# Patient Record
Sex: Female | Born: 2000 | Race: Black or African American | Hispanic: No | Marital: Single | Smoking: Never smoker
Health system: Southern US, Community
[De-identification: ages and names within clinical notes are randomized; demographics above are authoritative.]

## PROBLEM LIST (undated history)

## (undated) DIAGNOSIS — N6452 Nipple discharge: Secondary | ICD-10-CM

## (undated) DIAGNOSIS — L709 Acne, unspecified: Secondary | ICD-10-CM

## (undated) DIAGNOSIS — N898 Other specified noninflammatory disorders of vagina: Secondary | ICD-10-CM

## (undated) DIAGNOSIS — R32 Unspecified urinary incontinence: Secondary | ICD-10-CM

## (undated) DIAGNOSIS — T7840XA Allergy, unspecified, initial encounter: Secondary | ICD-10-CM

## (undated) DIAGNOSIS — F419 Anxiety disorder, unspecified: Secondary | ICD-10-CM

## (undated) HISTORY — DX: Nipple discharge: N64.52

## (undated) HISTORY — DX: Unspecified urinary incontinence: R32

## (undated) HISTORY — DX: Allergy, unspecified, initial encounter: T78.40XA

## (undated) HISTORY — PX: NO PAST SURGERIES: SHX2092

## (undated) HISTORY — DX: Acne, unspecified: L70.9

## (undated) HISTORY — DX: Anxiety disorder, unspecified: F41.9

## (undated) HISTORY — DX: Other specified noninflammatory disorders of vagina: N89.8

---
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
## 2018-04-09 ENCOUNTER — Other Ambulatory Visit: Payer: Self-pay | Admitting: Pediatrics

## 2018-04-09 DIAGNOSIS — N946 Dysmenorrhea, unspecified: Secondary | ICD-10-CM

## 2018-04-10 ENCOUNTER — Other Ambulatory Visit: Payer: Self-pay | Admitting: Pediatrics

## 2018-06-20 DIAGNOSIS — H5213 Myopia, bilateral: Secondary | ICD-10-CM | POA: Diagnosis not present

## 2018-06-28 DIAGNOSIS — H5213 Myopia, bilateral: Secondary | ICD-10-CM | POA: Diagnosis not present

## 2018-08-19 DIAGNOSIS — H5213 Myopia, bilateral: Secondary | ICD-10-CM | POA: Diagnosis not present

## 2018-10-11 ENCOUNTER — Ambulatory Visit: Payer: No Typology Code available for payment source | Admitting: Internal Medicine

## 2019-07-05 DIAGNOSIS — H5213 Myopia, bilateral: Secondary | ICD-10-CM | POA: Diagnosis not present

## 2019-07-05 DIAGNOSIS — H52223 Regular astigmatism, bilateral: Secondary | ICD-10-CM | POA: Diagnosis not present

## 2019-07-10 DIAGNOSIS — H5213 Myopia, bilateral: Secondary | ICD-10-CM | POA: Diagnosis not present

## 2019-08-21 DIAGNOSIS — H5213 Myopia, bilateral: Secondary | ICD-10-CM | POA: Diagnosis not present

## 2019-12-11 ENCOUNTER — Encounter: Payer: Self-pay | Admitting: Pediatrics

## 2020-03-05 ENCOUNTER — Ambulatory Visit: Payer: Medicaid Other | Attending: Nurse Practitioner | Admitting: Nurse Practitioner

## 2020-03-05 ENCOUNTER — Encounter: Payer: Self-pay | Admitting: Nurse Practitioner

## 2020-03-05 ENCOUNTER — Other Ambulatory Visit: Payer: Self-pay

## 2020-03-05 VITALS — Ht 62.0 in | Wt 145.0 lb

## 2020-03-05 DIAGNOSIS — Z7689 Persons encountering health services in other specified circumstances: Secondary | ICD-10-CM

## 2020-03-05 DIAGNOSIS — K219 Gastro-esophageal reflux disease without esophagitis: Secondary | ICD-10-CM | POA: Diagnosis not present

## 2020-03-05 DIAGNOSIS — E663 Overweight: Secondary | ICD-10-CM | POA: Diagnosis not present

## 2020-03-05 DIAGNOSIS — E559 Vitamin D deficiency, unspecified: Secondary | ICD-10-CM

## 2020-03-05 DIAGNOSIS — D509 Iron deficiency anemia, unspecified: Secondary | ICD-10-CM

## 2020-03-05 MED ORDER — FAMOTIDINE 20 MG PO TABS: 20.0000 mg | ORAL_TABLET | Freq: Two times a day (BID) | ORAL | 1 refills | Status: DC

## 2020-03-05 NOTE — Progress Notes (Signed)
Virtual Visit via Telephone Note Due to national recommendations of social distancing due to COVID 19, telehealth visit is felt to be most appropriate for this patient at this time.  I discussed the limitations, risks, security and privacy concerns of performing an evaluation and management service by telephone and the availability of in person appointments. I also discussed with the patient that there may be a patient responsible charge related to this service. The patient expressed understanding and agreed to proceed.    I connected with Sharon Gonzalez on 03/05/20  at   8:50 AM EDT  EDT by telephone and verified that I am speaking with the correct person using two identifiers.   Consent I discussed the limitations, risks, security and privacy concerns of performing an evaluation and management service by telephone and the availability of in person appointments. I also discussed with the patient that there may be a patient responsible charge related to this service. The patient expressed understanding and agreed to proceed.   Location of Patient: Private  Residence   Location of Provider: [facility]    Persons participating in Telemedicine visit: Bertram Denver FNP-BC YY Martin CMA Sharon Gonzalez    History of Present Illness: Telemedicine visit for: Establish care  GERD Patient complains of symptoms of heartburn. This has been associated with upper abdominal discomfort, midespigastric pain, nausea and vomiting  She denies bilious reflux, choking on food and unexpected weight loss. Symptoms have been present for 2 months. She denies dysphagia.  She has not lost weight. She denies melena, hematochezia, hematemesis, and coffee ground emesis. Medical therapy in the past has included none.  States after she eats she feels nauseous.  Denies constipation and endorses daily bowel movements.  Diet does not consist of daily fruits and vegetables or fiber.  She works  second shift (2-11pm). Sometimes eats late at night. Only vomited once. Remains mostly nauseous.  Symptoms are unrelated to any specific foods or any specific time of day.  Relieving factor: eats something salty afterwards or drinking water.     Past Medical History:  Diagnosis Date  . Acne   . Allergy    seasonal allergies  . Anxiety   . Breast discharge 08/30/14  . Enuresis   . Vaginal discharge 08/30/14    Past Surgical History:  Procedure Laterality Date  . NO PAST SURGERIES      Family History  Problem Relation Age of Onset  . Asthma Mother   . Hyperlipidemia Mother   . Asthma Sister   . Diabetes Maternal Aunt        great aunt  . Stroke Maternal Aunt        great aunt    Social History   Socioeconomic History  . Marital status: Single    Spouse name: Not on file  . Number of children: Not on file  . Years of education: Not on file  . Highest education level: Not on file  Occupational History  . Not on file  Tobacco Use  . Smoking status: Never Smoker  . Smokeless tobacco: Never Used  Vaping Use  . Vaping Use: Never used  Substance and Sexual Activity  . Alcohol use: Not Currently  . Drug use: Not Currently  . Sexual activity: Not Currently  Other Topics Concern  . Not on file  Social History Narrative   Lives with mother, step-father, 2 sisters and 2 brothers   Social Determinants of Health   Financial Resource Strain:   .  Difficulty of Paying Living Expenses:   Food Insecurity:   . Worried About Programme researcher, broadcasting/film/video in the Last Year:   . Barista in the Last Year:   Transportation Needs:   . Freight forwarder (Medical):   Marland Kitchen Lack of Transportation (Non-Medical):   Physical Activity:   . Days of Exercise per Week:   . Minutes of Exercise per Session:   Stress:   . Feeling of Stress :   Social Connections:   . Frequency of Communication with Friends and Family:   . Frequency of Social Gatherings with Friends and Family:   . Attends  Religious Services:   . Active Member of Clubs or Organizations:   . Attends Banker Meetings:   Marland Kitchen Marital Status:      Observations/Objective: Awake, alert and oriented x 3   Review of Systems  Constitutional: Negative for fever, malaise/fatigue and weight loss.  HENT: Negative.  Negative for nosebleeds.   Eyes: Negative.  Negative for blurred vision, double vision and photophobia.  Respiratory: Negative.  Negative for cough and shortness of breath.   Cardiovascular: Negative.  Negative for chest pain, palpitations and leg swelling.  Gastrointestinal: Positive for abdominal pain, heartburn and nausea. Negative for blood in stool, constipation, diarrhea, melena and vomiting.  Musculoskeletal: Negative.  Negative for myalgias.  Neurological: Negative.  Negative for dizziness, focal weakness, seizures and headaches.  Psychiatric/Behavioral: Negative.  Negative for suicidal ideas.    Assessment and Plan: Sharon Gonzalez was seen today for new patient (initial visit).  Diagnoses and all orders for this visit:  Encounter to establish care  Iron deficiency anemia, unspecified iron deficiency anemia type -     CBC  Vitamin D deficiency -     VITAMIN D 25 Hydroxy (Vit-D Deficiency, Fractures)  Overweight (BMI 25.0-29.9) -     Lipid panel  Gastroesophageal reflux disease without esophagitis -     famotidine (PEPCID) 20 MG tablet; Take 1 tablet (20 mg total) by mouth 2 (two) times daily. INSTRUCTIONS: Avoid GERD Triggers: acidic, spicy or fried foods, caffeine, coffee, sodas,  alcohol and chocolate.    Follow Up Instructions Return in about 4 weeks (around 04/02/2020).     I discussed the assessment and treatment plan with the patient. The patient was provided an opportunity to ask questions and all were answered. The patient agreed with the plan and demonstrated an understanding of the instructions.   The patient was advised to call back or seek an in-person evaluation if the  symptoms worsen or if the condition fails to improve as anticipated.  I provided 16 minutes of non-face-to-face time during this encounter including median intraservice time, reviewing previous notes, labs, imaging, medications and explaining diagnosis and management.  Claiborne Rigg, FNP-BC

## 2020-03-06 ENCOUNTER — Other Ambulatory Visit: Payer: Self-pay | Admitting: Nurse Practitioner

## 2020-03-06 ENCOUNTER — Encounter: Payer: Self-pay | Admitting: Nurse Practitioner

## 2020-03-06 LAB — LIPID PANEL
Chol/HDL Ratio: 4.3 ratio (ref 0.0–4.4)
Cholesterol, Total: 153 mg/dL (ref 100–199)
HDL: 36 mg/dL — ABNORMAL LOW (ref >39)
LDL Chol Calc (NIH): 96 mg/dL (ref 0–99)
Triglycerides: 115 mg/dL (ref 0–149)
VLDL Cholesterol Cal: 21 mg/dL (ref 5–40)

## 2020-03-06 LAB — CBC
Hematocrit: 31.6 % — ABNORMAL LOW (ref 34.0–46.6)
Hemoglobin: 9.3 g/dL — ABNORMAL LOW (ref 11.1–15.9)
MCH: 20.8 pg — ABNORMAL LOW (ref 26.6–33.0)
MCHC: 29.4 g/dL — ABNORMAL LOW (ref 31.5–35.7)
MCV: 71 fL — ABNORMAL LOW (ref 79–97)
Platelets: 357 10*3/uL (ref 150–450)
RBC: 4.47 x10E6/uL (ref 3.77–5.28)
RDW: 18.3 % — ABNORMAL HIGH (ref 11.7–15.4)
WBC: 10.6 10*3/uL (ref 3.4–10.8)

## 2020-03-06 LAB — VITAMIN D 25 HYDROXY (VIT D DEFICIENCY, FRACTURES): Vit D, 25-Hydroxy: 14.5 ng/mL — ABNORMAL LOW (ref 30.0–100.0)

## 2020-03-06 MED ORDER — VITAMIN D (ERGOCALCIFEROL) 1.25 MG (50000 UNIT) PO CAPS: 50000.0000 [IU] | ORAL_CAPSULE | ORAL | 1 refills | Status: DC

## 2020-03-06 MED ORDER — FERROUS SULFATE 324 (65 FE) MG PO TBEC: 1.0000 | DELAYED_RELEASE_TABLET | Freq: Every day | ORAL | 0 refills | Status: DC

## 2020-04-15 ENCOUNTER — Encounter: Payer: No Typology Code available for payment source | Admitting: Nurse Practitioner

## 2020-06-21 DIAGNOSIS — Z20822 Contact with and (suspected) exposure to covid-19: Secondary | ICD-10-CM | POA: Diagnosis not present

## 2020-08-01 ENCOUNTER — Ambulatory Visit: Payer: Self-pay

## 2020-08-01 NOTE — Telephone Encounter (Signed)
Patient called and before the agent could transfer to NT, she hung up the phone. I called the patient back and she says she is back at work and unable to talk. She asked if I could call back at 1930. I advised to call back tomorrow before work, patient verbalized understanding.

## 2020-08-02 ENCOUNTER — Ambulatory Visit: Payer: Self-pay | Admitting: *Deleted

## 2020-08-02 NOTE — Telephone Encounter (Signed)
C/o feeling faint at work yesterday. Patient reports she was standing at beginning of her shift and  her stomach was hurting and she became hot, breaking out into a sweat and saw black "splotches" . Marland Kitchen When patient sat down symptoms went away. Heart was beating fast. Patient reports a similar episode happened 2 months ago. Vertigo reported after standing . Patient denies chest pain, difficulty breathing, headache, she is not pregnant and is not dehydrated. appt scheduled for 09/18/20. Care advise given. Patient verbalized understanding of care advise and to call back or go to [facility] or ED or call 911 if symptoms worsen.   Reason for Disposition . [1] Sudden standing caused simple fainting AND [2] now alert and feels fine  Answer Assessment - Initial Assessment Questions 1. ONSET: "How long were you unconscious?" (minutes) "When did it happen?"     Na was not unconscious  2. CONTENT: "What happened during period of unconsciousness?" (e.g., seizure activity)      na 3. MENTAL STATUS: "Alert and oriented now?" (oriented x 3 = name, month, location)      yes 4. TRIGGER: "What do you think caused the fainting?" "What were you doing just before you fainted?"  (e.g., exercise, sudden standing up, prolonged standing)     Stands for whole shift. But patient had just started work 5. RECURRENT SYMPTOM: "Have you ever passed out before?" If Yes, ask: "When was the last time?" and "What happened that time?"      No. Did have similar episode 2 months ago  6. INJURY: "Did you sustain any injury during the fall?"      Na did not fall 7. CARDIAC SYMPTOMS: "Have you had any of the following symptoms: chest pain, difficulty breathing, palpitations?"     Heart beating fast 8. NEUROLOGIC SYMPTOMS: "Have you had any of the following symptoms: headache, numbness, vertigo, weakness?"     Vertigo  After standing, weakness 9. GI SYMPTOMS: "Have you had any of the following symptoms: abdominal pain, vomiting, diarrhea, blood  in stools?"     Abdominal pain like she needs to "use the bathroom" 10. OTHER SYMPTOMS: "Do you have any other symptoms?"       no 11. PREGNANCY: "Is there any chance you are pregnant?" "When was your last menstrual period?"       LMP 07/12/20  Protocols used: [facility]

## 2020-08-06 ENCOUNTER — Other Ambulatory Visit: Payer: Self-pay

## 2020-08-06 ENCOUNTER — Encounter (HOSPITAL_COMMUNITY): Payer: Self-pay

## 2020-08-06 ENCOUNTER — Ambulatory Visit (HOSPITAL_COMMUNITY)
Admission: EM | Admit: 2020-08-06 | Discharge: 2020-08-06 | Disposition: A | Discharge status: Home / Self Care | Payer: Medicaid Other | Attending: Emergency Medicine | Admitting: Emergency Medicine

## 2020-08-06 DIAGNOSIS — J029 Acute pharyngitis, unspecified: Secondary | ICD-10-CM | POA: Diagnosis not present

## 2020-08-06 DIAGNOSIS — Z20822 Contact with and (suspected) exposure to covid-19: Secondary | ICD-10-CM | POA: Insufficient documentation

## 2020-08-06 LAB — POCT RAPID STREP A, ED / UC: Streptococcus, Group A Screen (Direct): NEGATIVE

## 2020-08-06 LAB — SARS CORONAVIRUS 2 (TAT 6-24 HRS): SARS Coronavirus 2: NEGATIVE

## 2020-08-06 NOTE — ED Triage Notes (Signed)
Patient complains of a sore throat since Thursday as well as sore(s) on her tongue beginning yesterday. Pt states these have interfered with her eating. Pt is oax4 and ambulatory and denies any other symptoms.

## 2020-08-06 NOTE — ED Provider Notes (Signed)
[facility]    CSN: [id number] Arrival date & time: 08/06/20  1039      History   Chief Complaint Chief Complaint  Patient presents with  . Sore Throat    Since thursday    HPI Sharon Gonzalez is a 20 y.o. female.   Patient presents with 5 to 6-day history of sore throat.  She states she had "sores" on her tongue yesterday but these have resolved.  She reports no difficulty swallowing and good oral intake.  She denies fever, chills, rash, cough, shortness of breath, abdominal pain, vomiting, diarrhea, or other symptoms.  Treatment attempted at home with OTC cold medication.  The history is provided by the patient and medical records.    Past Medical History:  Diagnosis Date  . Acne   . Allergy    seasonal allergies  . Anxiety   . Breast discharge 08/30/14  . Enuresis   . Vaginal discharge 08/30/14    Patient Active Problem List   Diagnosis Date Noted  . Elevated BP without diagnosis of hypertension 02/24/2017  . Seasonal allergic rhinitis 02/24/2017  . Eczema 02/24/2017  . Vitamin D deficiency 01/22/2016  . Peanut allergy 01/20/2016  . Iron deficiency anemia 01/20/2016  . 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 Acne 02/01/2013  . Dysmenorrhea 02/01/2013    Past Surgical History:  Procedure Laterality Date  . NO PAST SURGERIES      OB History   No obstetric history on file.      Home Medications    Prior to Admission medications   Medication Sig Start Date End Date Taking? Authorizing Provider  ibuprofen (ADVIL,MOTRIN) 800 MG tablet TAKE 1 TABLET BY MOUTH AT THE SIGN OF CRAMPS THEN 6 EVERY AS NEEDED FOR PAIN 04/10/18  Yes Gregor Hams, NP  triamcinolone cream (KENALOG) 0.1 % APPLY TO ECZEMA RASH 3 TIMES DAILY FOR FLARE UPS 11/10/17  Yes Theadore Nan, MD  Vitamin D, Ergocalciferol, (DRISDOL) 1.25 MG (50000 UNIT) CAPS capsule Take 1 capsule (50,000 Units total) by mouth every 7 (seven) days. 03/06/20  Yes Claiborne Rigg, NP  famotidine (PEPCID) 20 MG tablet Take 1 tablet (20 mg total) by mouth 2 (two) times daily. 03/05/20 04/04/20  Claiborne Rigg, NP  ferrous sulfate 324 (65 Fe) MG TBEC Take 1 tablet (325 mg total) by mouth daily. 03/06/20 06/04/20  Claiborne Rigg, NP    Family History Family History  Problem Relation Age of Onset  . Asthma Mother   . Hyperlipidemia Mother   . Asthma Sister   . Diabetes Maternal Aunt        great aunt  . Stroke Maternal Aunt        great aunt    Social History Social History   Tobacco Use  . Smoking status: Never Smoker  . Smokeless tobacco: Never Used  Vaping Use  . Vaping Use: Never used  Substance Use Topics  . Alcohol use: Not Currently  . Drug use: Not Currently     Allergies   Peanut-containing drug products   Review of Systems Review of Systems  Constitutional: Negative for chills and fever.  HENT: Positive for sore throat. Negative for ear pain.   Eyes: Negative for pain and visual disturbance.  Respiratory: Negative for cough and shortness of breath.   Cardiovascular: Negative for chest pain and palpitations.  Gastrointestinal: Negative for abdominal pain, diarrhea and vomiting.  Genitourinary: Negative for dysuria and hematuria.  Musculoskeletal: Negative for  arthralgias and back pain.  Skin: Negative for color change and rash.  Neurological: Negative for seizures and syncope.  All other systems reviewed and are negative.    Physical Exam Triage Vital Signs ED Triage Vitals  Enc Vitals Group     BP 08/06/20 1151 118/73     Pulse Rate 08/06/20 1151 66     Resp 08/06/20 1151 18     Temp 08/06/20 1151 98.3 F (36.8 C)     Temp Source 08/06/20 1151 Oral     SpO2 08/06/20 1151 100 %     Weight --      Height --      Head Circumference --      Peak Flow --      Pain Score 08/06/20 1153 3     Pain Loc --      Pain Edu? --      Excl. in GC? --    No data found.  Updated Vital Signs BP 118/73 (BP Location: Right Arm)    Pulse 66   Temp 98.3 F (36.8 C) (Oral)   Resp 18   LMP 07/12/2020 (Exact Date)   SpO2 100%   Visual Acuity Right Eye Distance:   Left Eye Distance:   Bilateral Distance:    Right Eye Near:   Left Eye Near:    Bilateral Near:     Physical Exam Vitals and nursing note reviewed.  Constitutional:      General: She is not in acute distress.    Appearance: She is well-developed and well-nourished. She is not ill-appearing.  HENT:     Head: Normocephalic and atraumatic.     Right Ear: Tympanic membrane normal.     Left Ear: Tympanic membrane normal.     Nose: Nose normal.     Mouth/Throat:     Mouth: Mucous membranes are moist.     Pharynx: Posterior oropharyngeal erythema present.  Eyes:     Conjunctiva/sclera: Conjunctivae normal.  Cardiovascular:     Rate and Rhythm: Normal rate and regular rhythm.     Heart sounds: Normal heart sounds.  Pulmonary:     Effort: Pulmonary effort is normal. No respiratory distress.     Breath sounds: Normal breath sounds.  Abdominal:     Palpations: Abdomen is soft.     Tenderness: There is no abdominal tenderness. There is no guarding or rebound.  Musculoskeletal:        General: No edema.     Cervical back: Neck supple.  Skin:    General: Skin is warm and dry.     Findings: No rash.  Neurological:     General: No focal deficit present.     Mental Status: She is alert and oriented to person, place, and time.     Gait: Gait normal.  Psychiatric:        Mood and Affect: Mood and affect and mood normal.        Behavior: Behavior normal.      UC Treatments / Results  Labs (all labs ordered are listed, but only abnormal results are displayed) Labs Reviewed  CULTURE, GROUP A STREP ([facility])  SARS CORONAVIRUS 2 (TAT 6-24 HRS)  POCT RAPID STREP A, ED / UC    EKG   Radiology No results found.  Procedures Procedures (including critical care time)  Medications Ordered in UC Medications - No data to display  Initial  Impression / Assessment and Plan / UC Course  I have reviewed the  triage vital signs and the nursing notes.  Pertinent labs & imaging results that were available during my care of the patient were reviewed by me and considered in my medical decision making (see chart for details).   Viral pharyngitis.  Rapid strep negative; culture pending.  COVID pending.  Instructed patient to self quarantine until the test results are back.  Discussed symptomatic treatment including Tylenol, rest, hydration.  Instructed patient to follow up with PCP if her symptoms are not improving  Patient agrees to plan of care.    Final Clinical Impressions(s) / UC Diagnoses   Final diagnoses:  Viral pharyngitis     Discharge Instructions     Your rapid strep test is negative.  A throat culture is pending; we will call you if it is positive requiring treatment.    Your COVID test is pending.  You should self quarantine until the test result is back.    Take Tylenol or ibuprofen as needed for fever or discomfort.  Rest and keep yourself hydrated.    Follow-up with your primary care provider if your symptoms are not improving.        ED Prescriptions    None     PDMP not reviewed this encounter.   Mickie Bail, NP 08/06/20 1250

## 2020-08-06 NOTE — Discharge Instructions (Addendum)
Your rapid strep test is negative.  A throat culture is pending; we will call you if it is positive requiring treatment.    Your COVID test is pending.  You should self quarantine until the test result is back.    Take Tylenol or ibuprofen as needed for fever or discomfort.  Rest and keep yourself hydrated.    Follow-up with your primary care provider if your symptoms are not improving.     

## 2020-08-09 LAB — CULTURE, GROUP A STREP (THRC)

## 2020-09-18 ENCOUNTER — Encounter: Payer: Self-pay | Admitting: Nurse Practitioner

## 2020-09-18 ENCOUNTER — Ambulatory Visit: Payer: Medicaid Other | Attending: Nurse Practitioner | Admitting: Nurse Practitioner

## 2020-09-18 ENCOUNTER — Other Ambulatory Visit: Payer: Self-pay

## 2020-09-18 VITALS — BP 112/79 | HR 89 | Temp 97.8°F | Ht 63.0 in | Wt 149.0 lb

## 2020-09-18 DIAGNOSIS — N946 Dysmenorrhea, unspecified: Secondary | ICD-10-CM

## 2020-09-18 DIAGNOSIS — K5909 Other constipation: Secondary | ICD-10-CM | POA: Diagnosis not present

## 2020-09-18 DIAGNOSIS — D75839 Thrombocytosis, unspecified: Secondary | ICD-10-CM

## 2020-09-18 DIAGNOSIS — Z0000 Encounter for general adult medical examination without abnormal findings: Secondary | ICD-10-CM | POA: Diagnosis not present

## 2020-09-18 DIAGNOSIS — D509 Iron deficiency anemia, unspecified: Secondary | ICD-10-CM

## 2020-09-18 DIAGNOSIS — L309 Dermatitis, unspecified: Secondary | ICD-10-CM | POA: Diagnosis not present

## 2020-09-18 MED ORDER — IBUPROFEN 800 MG PO TABS: ORAL_TABLET | ORAL | 1 refills | Status: DC

## 2020-09-18 MED ORDER — POLYETHYLENE GLYCOL 3350 17 GM/SCOOP PO POWD: ORAL | 1 refills | Status: DC

## 2020-09-18 MED ORDER — FERROUS SULFATE 324 (65 FE) MG PO TBEC: 1.0000 | DELAYED_RELEASE_TABLET | Freq: Every day | ORAL | 1 refills | Status: DC

## 2020-09-18 MED ORDER — TRIAMCINOLONE ACETONIDE 0.1 % EX CREA: TOPICAL_CREAM | CUTANEOUS | 3 refills | Status: DC

## 2020-09-18 NOTE — Progress Notes (Signed)
Assessment & Plan:  Sharon Gonzalez was seen today for annual exam.  Diagnoses and all orders for this visit:  Encounter for annual physical exam  Iron deficiency anemia, unspecified iron deficiency anemia type -     CBC -     Basic metabolic panel -     ferrous sulfate 324 (65 Fe) MG TBEC; Take 1 tablet (325 mg total) by mouth daily. May take every other day if causes constipation  Dysmenorrhea -     ibuprofen (ADVIL) 800 MG tablet; TAKE 1 TABLET BY MOUTH AT THE SIGN OF CRAMPS THEN 6 EVERY AS NEEDED FOR PAIN  Chronic constipation -     polyethylene glycol powder (GLYCOLAX/MIRALAX) 17 GM/SCOOP powder; Take one capful dissolved in 16 oz of water daily.  Eczema, unspecified type -     triamcinolone (KENALOG) 0.1 %; APPLY TO ECZEMA RASH 3 TIMES DAILY FOR FLARE UPS    Patient has been counseled on age-appropriate routine health concerns for screening and prevention. These are reviewed and up-to-date. Referrals have been placed accordingly. Immunizations are up-to-date or declined.    Subjective:   Chief Complaint  Patient presents with  . Annual Exam    Patient is here for a physical.    HPI Sharon Gonzalez 20 y.o. female presents to office today for a physical exam. She is sexually active however declines pap smear today.    Will refill triamcinolone today however she is not currently experiencing a flare up of her eczema.   Chronic Constipation Needs to increase intake or water, fruits and vegetables. Bowel movements only occurring a few times per week. Stools are hard.    Review of Systems  Constitutional: Negative for fever, malaise/fatigue and weight loss.  HENT: Negative.  Negative for nosebleeds.   Eyes: Negative.  Negative for blurred vision, double vision and photophobia.  Respiratory: Negative.  Negative for cough and shortness of breath.   Cardiovascular: Negative.  Negative for chest pain, palpitations and leg swelling.  Gastrointestinal: Negative.  Negative for  heartburn, nausea and vomiting.  Genitourinary: Negative.   Musculoskeletal: Negative.  Negative for myalgias.  Skin: Negative.   Neurological: Negative.  Negative for dizziness, focal weakness, seizures and headaches.  Endo/Heme/Allergies: Negative.   Psychiatric/Behavioral: Negative.  Negative for suicidal ideas.    Past Medical History:  Diagnosis Date  . Acne   . Allergy    seasonal allergies  . Anxiety   . Breast discharge 08/30/14  . Enuresis   . Vaginal discharge 08/30/14    Past Surgical History:  Procedure Laterality Date  . NO PAST SURGERIES      Family History  Problem Relation Age of Onset  . Asthma Mother   . Hyperlipidemia Mother   . Asthma Sister   . Diabetes Maternal Aunt        great aunt  . Stroke Maternal Aunt        great aunt    Social History Reviewed with no changes to be made today.   Outpatient Medications Prior to Visit  Medication Sig Dispense Refill  . Vitamin D, Ergocalciferol, (DRISDOL) 1.25 MG (50000 UNIT) CAPS capsule Take 1 capsule (50,000 Units total) by mouth every 7 (seven) days. 12 capsule 1  . ibuprofen (ADVIL,MOTRIN) 800 MG tablet TAKE 1 TABLET BY MOUTH AT THE SIGN OF CRAMPS THEN 6 EVERY AS NEEDED FOR PAIN 30 tablet 0  . triamcinolone cream (KENALOG) 0.1 % APPLY TO ECZEMA RASH 3 TIMES DAILY FOR FLARE UPS 454 g 0  .  famotidine (PEPCID) 20 MG tablet Take 1 tablet (20 mg total) by mouth 2 (two) times daily. 60 tablet 1  . ferrous sulfate 324 (65 Fe) MG TBEC Take 1 tablet (325 mg total) by mouth daily. 90 tablet 0   No facility-administered medications prior to visit.    Allergies  Allergen Reactions  . Peanut-Containing Drug Products Anaphylaxis       Objective:    BP 112/79 (BP Location: Left Arm, Patient Position: Sitting, Cuff Size: Normal)   Pulse 89   Temp 97.8 F (36.6 C) (Oral)   Ht 5\' 3"  (1.6 m)   Wt 149 lb (67.6 kg)   LMP 09/03/2020   SpO2 98%   BMI 26.39 kg/m  Wt Readings from Last 3 Encounters:  09/18/20  149 lb (67.6 kg)  03/05/20 145 lb (65.8 kg)  03/26/17 157 lb (71.2 kg) (89 %, Z= 1.22)*   * Growth percentiles are based on CDC (Girls, 2-20 Years) data.    Physical Exam Constitutional:      Appearance: She is well-developed and well-nourished.  HENT:     Head: Normocephalic and atraumatic.     Right Ear: External ear normal.     Left Ear: External ear normal.     Nose: Nose normal.     Mouth/Throat:     Lips: Pink.     Mouth: Oropharynx is clear and moist.     Dentition: No dental tenderness, gingival swelling, dental abscesses or gum lesions.     Pharynx: No oropharyngeal exudate.  Eyes:     General: No scleral icterus.       Right eye: No discharge.     Extraocular Movements: Extraocular movements intact and EOM normal.     Conjunctiva/sclera: Conjunctivae normal.     Pupils: Pupils are equal, round, and reactive to light.  Neck:     Thyroid: No thyromegaly.     Trachea: No tracheal deviation.  Cardiovascular:     Rate and Rhythm: Normal rate and regular rhythm.     Pulses: Intact distal pulses.     Heart sounds: Normal heart sounds. No murmur heard. No friction rub.  Pulmonary:     Effort: Pulmonary effort is normal. No accessory muscle usage or respiratory distress.     Breath sounds: Normal breath sounds. No decreased breath sounds, wheezing, rhonchi or rales.  Abdominal:     General: Bowel sounds are normal. There is no distension.     Palpations: Abdomen is soft. There is no mass.     Tenderness: There is no abdominal tenderness. There is no guarding or rebound.  Musculoskeletal:        General: No tenderness, deformity or edema. Normal range of motion.     Cervical back: Normal range of motion and neck supple.  Lymphadenopathy:     Cervical: No cervical adenopathy.  Skin:    General: Skin is warm and dry.     Findings: No erythema.  Neurological:     Mental Status: She is alert and oriented to person, place, and time.     Cranial Nerves: No cranial nerve  deficit.     Coordination: Coordination normal.     Deep Tendon Reflexes: Reflexes are normal and symmetric.     Reflex Scores:      Patellar reflexes are 2+ on the right side and 2+ on the left side. Psychiatric:        Mood and Affect: Mood and affect normal.  Speech: Speech normal.        Behavior: Behavior normal.        Thought Content: Thought content normal.        Judgment: Judgment normal.          Patient has been counseled extensively about nutrition and exercise as well as the importance of adherence with medications and regular follow-up. The patient was given clear instructions to go to ER or return to medical center if symptoms don't improve, worsen or new problems develop. The patient verbalized understanding.   Follow-up: Return if symptoms worsen or fail to improve.   Claiborne Rigg, FNP-BC [facility] [place], [place] [phone]   09/21/2020, 10:36 AM

## 2020-09-19 LAB — CBC
Hematocrit: 26.6 % — ABNORMAL LOW (ref 34.0–46.6)
Hemoglobin: 7.4 g/dL — ABNORMAL LOW (ref 11.1–15.9)
MCH: 18.8 pg — ABNORMAL LOW (ref 26.6–33.0)
MCHC: 27.8 g/dL — ABNORMAL LOW (ref 31.5–35.7)
MCV: 68 fL — ABNORMAL LOW (ref 79–97)
Platelets: 818 10*3/uL (ref 150–450)
RBC: 3.93 x10E6/uL (ref 3.77–5.28)
RDW: 22.8 % — ABNORMAL HIGH (ref 11.7–15.4)
WBC: 8.7 10*3/uL (ref 3.4–10.8)

## 2020-09-19 LAB — BASIC METABOLIC PANEL
BUN/Creatinine Ratio: 15 (ref 9–23)
BUN: 9 mg/dL (ref 6–20)
CO2: 20 mmol/L (ref 20–29)
Calcium: 9.2 mg/dL (ref 8.7–10.2)
Chloride: 101 mmol/L (ref 96–106)
Creatinine, Ser: 0.62 mg/dL (ref 0.57–1.00)
GFR calc Af Amer: 149 mL/min/{1.73_m2} (ref >59)
GFR calc non Af Amer: 129 mL/min/{1.73_m2} (ref >59)
Glucose: 75 mg/dL (ref 65–99)
Potassium: 4.6 mmol/L (ref 3.5–5.2)
Sodium: 138 mmol/L (ref 134–144)

## 2020-09-21 ENCOUNTER — Encounter: Payer: Self-pay | Admitting: Nurse Practitioner

## 2020-09-23 ENCOUNTER — Encounter: Payer: Self-pay | Admitting: Nurse Practitioner

## 2020-09-25 ENCOUNTER — Ambulatory Visit: Payer: No Typology Code available for payment source | Admitting: Family Medicine

## 2020-11-11 ENCOUNTER — Encounter: Payer: Self-pay | Admitting: Nurse Practitioner

## 2020-11-11 ENCOUNTER — Other Ambulatory Visit: Payer: Self-pay

## 2020-11-11 ENCOUNTER — Ambulatory Visit: Payer: Medicaid Other | Attending: Nurse Practitioner | Admitting: Nurse Practitioner

## 2020-11-11 DIAGNOSIS — Z30011 Encounter for initial prescription of contraceptive pills: Secondary | ICD-10-CM

## 2020-11-11 DIAGNOSIS — Z3009 Encounter for other general counseling and advice on contraception: Secondary | ICD-10-CM | POA: Diagnosis not present

## 2020-11-11 DIAGNOSIS — D509 Iron deficiency anemia, unspecified: Secondary | ICD-10-CM | POA: Diagnosis not present

## 2020-11-11 MED ORDER — FERROUS SULFATE 324 (65 FE) MG PO TBEC: 1.0000 | DELAYED_RELEASE_TABLET | Freq: Every day | ORAL | 1 refills | Status: DC

## 2020-11-11 MED ORDER — XULANE 150-35 MCG/24HR TD PTWK
1.0000 | MEDICATED_PATCH | TRANSDERMAL | 12 refills | Status: DC
Start: 2020-11-11 — End: 2021-01-22

## 2020-11-11 NOTE — Progress Notes (Signed)
Virtual Visit via Telephone Note Due to national recommendations of social distancing due to COVID 19, telehealth visit is felt to be most appropriate for this patient at this time.  I discussed the limitations, risks, security and privacy concerns of performing an evaluation and management service by telephone and the availability of in person appointments. I also discussed with the patient that there may be a patient responsible charge related to this service. The patient expressed understanding and agreed to proceed.    I connected with Sharon Gonzalez on 11/11/20  at   3:10 PM EDT  EDT by telephone and verified that I am speaking with the correct person using two identifiers.   Consent I discussed the limitations, risks, security and privacy concerns of performing an evaluation and management service by telephone and the availability of in person appointments. I also discussed with the patient that there may be a patient responsible charge related to this service. The patient expressed understanding and agreed to proceed.   Location of Patient: Private  Residence   Location of Provider: [facility]    Persons participating in Telemedicine visit: Sharon Gonzalez Sharon Gonzalez    History of Present Illness: Telemedicine visit for: Follow up   Contraception Counseling: Patient presents for contraception counseling. She is interested in starting the hormonal patch as a means to reduce her menorrhagia.  The patient has no complaints today. We discussed different forms of birth control and she would like to proceed with the patch. The patient is not sexually active. Pertinent past medical history: none. Menstrual cycles last 7 days. Heavy flow on days 1-3.      Past Medical History:  Diagnosis Date  . Acne   . Allergy    seasonal allergies  . Anxiety   . Breast discharge 08/30/14  . Enuresis   . Vaginal discharge 08/30/14    Past  Surgical History:  Procedure Laterality Date  . NO PAST SURGERIES      Family History  Problem Relation Age of Onset  . Asthma Mother   . Hyperlipidemia Mother   . Asthma Sister   . Diabetes Maternal Aunt        great aunt  . Stroke Maternal Aunt        great aunt    Social History   Socioeconomic History  . Marital status: Single    Spouse name: Not on file  . Number of children: Not on file  . Years of education: Not on file  . Highest education level: Not on file  Occupational History  . Not on file  Tobacco Use  . Smoking status: Never Smoker  . Smokeless tobacco: Never Used  Vaping Use  . Vaping Use: Never used  Substance and Sexual Activity  . Alcohol use: Not Currently  . Drug use: Not Currently  . Sexual activity: Not Currently    Birth control/protection: None  Other Topics Concern  . Not on file  Social History Narrative   Lives with mother, step-father, 2 sisters and 2 brothers   Social Determinants of Health   Financial Resource Strain: Not on file  Food Insecurity: Not on file  Transportation Needs: Not on file  Physical Activity: Not on file  Stress: Not on file  Social Connections: Not on file     Observations/Objective: Awake, alert and oriented x 3   Review of Systems  Constitutional: Negative for fever, malaise/fatigue and weight loss.  HENT: Negative.  Negative for nosebleeds.   Eyes: Negative.  Negative for blurred vision, double vision and photophobia.  Respiratory: Negative.  Negative for cough and shortness of breath.   Cardiovascular: Negative.  Negative for chest pain, palpitations and leg swelling.  Gastrointestinal: Negative.  Negative for heartburn, nausea and vomiting.  Genitourinary:       Menorrhagia   Musculoskeletal: Negative.  Negative for myalgias.  Neurological: Negative.  Negative for dizziness, focal weakness, seizures and headaches.  Psychiatric/Behavioral: Negative.  Negative for suicidal ideas.    Assessment  and Plan: Sharon Gonzalez was seen today for contraception.  Diagnoses and all orders for this visit:  Initiation of oral contraception -     POCT urine pregnancy; Future -     norelgestromin-ethinyl estradiol [employer]ico) 150-35 MCG/24HR transdermal patch; Place 1 patch onto the skin once a week.  Encounter for other general counseling and advice on contraception -     norelgestromin-ethinyl estradiol [employer]ico) 150-35 MCG/24HR transdermal patch; Place 1 patch onto the skin once a week.  Iron deficiency anemia, unspecified iron deficiency anemia type -     ferrous sulfate 324 (65 Fe) MG TBEC; Take 1 tablet (325 mg total) by mouth daily. May take every other day if causes constipation     Follow Up Instructions Return for CBC pending. .     I discussed the assessment and treatment plan with the patient. The patient was provided an opportunity to ask questions and all were answered. The patient agreed with the plan and demonstrated an understanding of the instructions.   The patient was advised to call back or seek an in-person evaluation if the symptoms worsen or if the condition fails to improve as anticipated.  I provided 15 minutes of non-face-to-face time during this encounter including median intraservice time, reviewing previous notes, labs, imaging, medications and explaining diagnosis and management.  Claiborne Rigg, FNP-BC

## 2020-11-14 ENCOUNTER — Other Ambulatory Visit: Payer: Self-pay

## 2020-11-14 DIAGNOSIS — D75839 Thrombocytosis, unspecified: Secondary | ICD-10-CM

## 2020-11-14 DIAGNOSIS — D509 Iron deficiency anemia, unspecified: Secondary | ICD-10-CM | POA: Diagnosis not present

## 2020-11-15 ENCOUNTER — Telehealth: Payer: Self-pay | Admitting: Internal Medicine

## 2020-11-15 LAB — CBC WITH DIFFERENTIAL/PLATELET
Basophils Absolute: 0.1 10*3/uL (ref 0.0–0.2)
Basos: 1 %
EOS (ABSOLUTE): 0.3 10*3/uL (ref 0.0–0.4)
Eos: 4 %
Hematocrit: 29.3 % — ABNORMAL LOW (ref 34.0–46.6)
Hemoglobin: 7.8 g/dL — ABNORMAL LOW (ref 11.1–15.9)
Immature Grans (Abs): 0 10*3/uL (ref 0.0–0.1)
Immature Granulocytes: 0 %
Lymphocytes Absolute: 2.2 10*3/uL (ref 0.7–3.1)
Lymphs: 32 %
MCH: 18.6 pg — ABNORMAL LOW (ref 26.6–33.0)
MCHC: 26.6 g/dL — ABNORMAL LOW (ref 31.5–35.7)
MCV: 70 fL — ABNORMAL LOW (ref 79–97)
Monocytes Absolute: 0.5 10*3/uL (ref 0.1–0.9)
Monocytes: 7 %
Neutrophils Absolute: 3.6 10*3/uL (ref 1.4–7.0)
Neutrophils: 56 %
Platelets: 1139 10*3/uL (ref 150–450)
RBC: 4.2 x10E6/uL (ref 3.77–5.28)
RDW: 26.8 % — ABNORMAL HIGH (ref 11.7–15.4)
WBC: 6.7 10*3/uL (ref 3.4–10.8)

## 2020-11-15 LAB — IRON,TIBC AND FERRITIN PANEL
Ferritin: 22 ng/mL (ref 15–150)
Iron Saturation: 3 % — CL (ref 15–55)
Iron: 12 ug/dL — ABNORMAL LOW (ref 27–159)
Total Iron Binding Capacity: 444 ug/dL (ref 250–450)
UIBC: 432 ug/dL — ABNORMAL HIGH (ref 131–425)

## 2020-11-15 NOTE — Telephone Encounter (Signed)
I received a call from the on-call nurse at the call center this a.m. at 6:47.  I was informed of critical platelet count of 1139 with previous count being 818.  On looking at patient's chart, I see that she has iron deficiency anemia. Call placed to patient this morning.  I asked about whether she has been having any headaches, blurred vision, bleeding gums or bruising.  Patient states that she has headaches about once a week.  She denies any blurred vision.  Occasional bleeding gums.  No bruising.  She admits that she has not been taking iron consistently.  Inform patient of her lab results showing that she is still anemic which has not improved much and increased platelet count.  Thrombocytosis can be seen in iron deficiency anemia OR this can be essential thrombocytosis.  I strongly suggest that she take the iron tablets consistently for at least a month and then her PCP can recheck CBC.  If platelet count still elevated at that time, she should be referred to a hematologist.  Other option is that her PCP may choose to send her to a hematologist at this time.  Message sent to her PCP.

## 2020-11-17 ENCOUNTER — Other Ambulatory Visit: Payer: Self-pay | Admitting: Nurse Practitioner

## 2020-11-17 ENCOUNTER — Encounter: Payer: Self-pay | Admitting: Nurse Practitioner

## 2020-11-17 DIAGNOSIS — D75839 Thrombocytosis, unspecified: Secondary | ICD-10-CM

## 2020-11-17 NOTE — Telephone Encounter (Signed)
NOTED

## 2020-11-18 ENCOUNTER — Telehealth: Payer: Self-pay | Admitting: Physician Assistant

## 2020-11-18 NOTE — Telephone Encounter (Signed)
Received a new hem referral from dr. Laural Benes for thrombocytosis. Sharon Gonzalez has been cld and scheduled to see [name] on 5/2 at 2pm. Pt aware to arrive 20 minutes early.

## 2020-11-25 ENCOUNTER — Inpatient Hospital Stay: Payer: Medicaid Other

## 2020-11-25 ENCOUNTER — Other Ambulatory Visit: Payer: Self-pay

## 2020-11-25 ENCOUNTER — Encounter: Payer: Self-pay | Admitting: Nurse Practitioner

## 2020-11-25 ENCOUNTER — Encounter: Payer: Self-pay | Admitting: Physician Assistant

## 2020-11-25 ENCOUNTER — Inpatient Hospital Stay: Payer: Medicaid Other | Attending: Physician Assistant | Admitting: Physician Assistant

## 2020-11-25 VITALS — BP 115/69 | HR 83 | Temp 97.9°F | Resp 17 | Ht 63.0 in | Wt 151.2 lb

## 2020-11-25 DIAGNOSIS — D500 Iron deficiency anemia secondary to blood loss (chronic): Secondary | ICD-10-CM | POA: Diagnosis not present

## 2020-11-25 DIAGNOSIS — D75839 Thrombocytosis, unspecified: Secondary | ICD-10-CM | POA: Diagnosis not present

## 2020-11-25 DIAGNOSIS — N920 Excessive and frequent menstruation with regular cycle: Secondary | ICD-10-CM

## 2020-11-25 LAB — CBC WITH DIFFERENTIAL (CANCER CENTER ONLY)
Abs Immature Granulocytes: 0.01 10*3/uL (ref 0.00–0.07)
Basophils Absolute: 0.1 10*3/uL (ref 0.0–0.1)
Basophils Relative: 1 %
Eosinophils Absolute: 0.4 10*3/uL (ref 0.0–0.5)
Eosinophils Relative: 6 %
HCT: 30.8 % — ABNORMAL LOW (ref 36.0–46.0)
Hemoglobin: 8.3 g/dL — ABNORMAL LOW (ref 12.0–15.0)
Immature Granulocytes: 0 %
Lymphocytes Relative: 43 %
Lymphs Abs: 3 10*3/uL (ref 0.7–4.0)
MCH: 18.3 pg — ABNORMAL LOW (ref 26.0–34.0)
MCHC: 26.9 g/dL — ABNORMAL LOW (ref 30.0–36.0)
MCV: 68 fL — ABNORMAL LOW (ref 80.0–100.0)
Monocytes Absolute: 0.7 10*3/uL (ref 0.1–1.0)
Monocytes Relative: 11 %
Neutro Abs: 2.7 10*3/uL (ref 1.7–7.7)
Neutrophils Relative %: 39 %
Platelet Count: 379 10*3/uL (ref 150–400)
RBC: 4.53 MIL/uL (ref 3.87–5.11)
RDW: 27.5 % — ABNORMAL HIGH (ref 11.5–15.5)
WBC Count: 6.9 10*3/uL (ref 4.0–10.5)
nRBC: 0 % (ref 0.0–0.2)

## 2020-11-25 LAB — RETIC PANEL
Immature Retic Fract: 38 % — ABNORMAL HIGH (ref 2.3–15.9)
RBC.: 4.52 MIL/uL (ref 3.87–5.11)
Retic Count, Absolute: 35.7 10*3/uL (ref 19.0–186.0)
Retic Ct Pct: 0.8 % (ref 0.4–3.1)
Reticulocyte Hemoglobin: 21.6 pg — ABNORMAL LOW (ref >27.9)

## 2020-11-25 LAB — SAVE SMEAR(SSMR), FOR PROVIDER SLIDE REVIEW

## 2020-11-25 LAB — C-REACTIVE PROTEIN: CRP: 0.8 mg/dL (ref <1.0)

## 2020-11-25 NOTE — Progress Notes (Signed)
[facility] [phone]   [phone]  INITIAL CONSULT NOTE  Patient Care Team: Claiborne Rigg, NP as PCP - General (Nurse Practitioner)  Hematological/Oncological History 1) Labs from prior: -09/18/2020: WBC 8.7, Hgb 7.4 (L), MCV 68 (L), Plt 818 (H) -11/14/2020: WBC 6.7, Hgb 7.8 (L), MCV 70 (L), Plt 1139 (H), TIBC 444, UIBC 432 (H), Iron 12 (L), Ferritin 22, Iron saturation 3% (L)  2) 11/25/2020: Establish care with Sharon Kaufmann PA-C  CHIEF COMPLAINTS/PURPOSE OF CONSULTATION:  "Thrombocytosis"  HISTORY OF PRESENTING ILLNESS:  Sharon Gonzalez 20 y.o. female with medical history significant for iron deficiency anemia presents to the clinic for thrombocytosis. Patient is accompanied by her mother for this visit.   On review of the previous records, Sharon Gonzalez was found to have worsening iron deficiency anemia on 11/14/2020 by PCP, Bertram Denver NP. It was felt to be secondary to menorrhagia and was started on daily ferrous sulfate daily and transdermal hormonal patch.   On exam today, Sharon Gonzalez reports chronic fatigue over the last two years. 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 She denies any changes to her appetite or dietary restrictions. Patient has occasional episodes of nausea, mainly when it is too hot at work. She denies any vomiting episodes or abdominal pain. Patient reports constipation after starting oral iron last week. She notes improvement of constipation after taking miralax. Patient denies easy bruising or signs of bleeding including hematochezia, melena, hematuria, hemoptysis, epistaxis or gingival bleeding. Patient has menstrual cycle every 28 days but cycle is heavy for 3 days. She requires changing a tampon and pad every hour. She recently started transdermal hormonal patch last week without any complications thus far. Patient has intermittent episodes of headaches, dizziness and presyncopal episodes. Patient  has mild shortness of breath with exertion but none at rest. Patient denies any fevers, chills, night sweats, chest pain, cough, edema or neuropathy. She has no other complaints. Rest of the 10 point ROS is below.   MEDICAL HISTORY:  Past Medical History:  Diagnosis Date  . Acne   . Allergy    seasonal allergies  . Anxiety   . Breast discharge 08/30/14  . Enuresis   . Vaginal discharge 08/30/14    SURGICAL HISTORY: Past Surgical History:  Procedure Laterality Date  . NO PAST SURGERIES      SOCIAL HISTORY: Social History   Socioeconomic History  . Marital status: Single    Spouse name: Not on file  . Number of children: Not on file  . Years of education: Not on file  . Highest education level: Not on file  Occupational History  . Not on file  Tobacco Use  . Smoking status: Never Smoker  . Smokeless tobacco: Never Used  Vaping Use  . Vaping Use: Never used  Substance and Sexual Activity  . Alcohol use: Not Currently  . Drug use: Not Currently  . Sexual activity: Not Currently    Birth control/protection: None  Other Topics Concern  . Not on file  Social History Narrative   Lives with mother, step-father, 2 sisters and 2 brothers   Social Determinants of Health   Financial Resource Strain: Not on file  Food Insecurity: Not on file  Transportation Needs: Not on file  Physical Activity: Not on file  Stress: Not on file  Social Connections: Not on file  Intimate Partner Violence: Not on file    FAMILY HISTORY: Family History  Problem  Relation Age of Onset  . Asthma Mother   . Hyperlipidemia Mother   . Iron deficiency Mother   . Asthma Sister   . Diabetes Maternal Aunt        great aunt  . Stroke Maternal Aunt        great aunt    ALLERGIES:  is allergic to peanut-containing drug products.  MEDICATIONS:  Current Outpatient Medications  Medication Sig Dispense Refill  . ferrous sulfate 324 (65 Fe) MG TBEC Take 1 tablet (325 mg total) by mouth daily. May  take every other day if causes constipation 90 tablet 1  . ibuprofen (ADVIL) 800 MG tablet TAKE 1 TABLET BY MOUTH AT THE SIGN OF CRAMPS THEN 6 EVERY AS NEEDED FOR PAIN 60 tablet 1  . norelgestromin-ethinyl estradiol Burr Medico) 150-35 MCG/24HR transdermal patch Place 1 patch onto the skin once a week. 3 patch 12  . polyethylene glycol powder (GLYCOLAX/MIRALAX) 17 GM/SCOOP powder Take one capful dissolved in 16 oz of water daily. 3350 g 1  . triamcinolone (KENALOG) 0.1 % APPLY TO ECZEMA RASH 3 TIMES DAILY FOR FLARE UPS 240 g 3  . Vitamin D, Ergocalciferol, (DRISDOL) 1.25 MG (50000 UNIT) CAPS capsule Take 1 capsule (50,000 Units total) by mouth every 7 (seven) days. 12 capsule 1   No current facility-administered medications for this visit.    REVIEW OF SYSTEMS:   Constitutional: ( - ) fevers, ( - )  chills , ( - ) night sweats Eyes: ( - ) blurriness of vision, ( - ) double vision, ( - ) watery eyes Ears, nose, mouth, throat, and face: ( - ) mucositis, ( - ) sore throat Respiratory: ( - ) cough, ( - ) dyspnea, ( - ) wheezes Cardiovascular: ( - ) palpitation, ( - ) chest discomfort, ( - ) lower extremity swelling Gastrointestinal:  ( - ) nausea, ( - ) heartburn, ( - ) change in bowel habits Skin: ( - ) abnormal skin rashes Lymphatics: ( - ) new lymphadenopathy, ( - ) easy bruising Neurological: ( - ) numbness, ( - ) tingling, ( - ) new weaknesses Behavioral/Psych: ( - ) mood change, ( - ) new changes  All other systems were reviewed with the patient and are negative.  PHYSICAL EXAMINATION: ECOG PERFORMANCE STATUS: 1 - Symptomatic but completely ambulatory  Vitals:   11/25/20 1417  BP: 115/69  Pulse: 83  Resp: 17  Temp: 97.9 F (36.6 C)  SpO2: 100%   Filed Weights   11/25/20 1417  Weight: 151 lb 3.2 oz (68.6 kg)    GENERAL: well appearing African American female in NAD  SKIN: skin color, texture, turgor are normal, no rashes or significant lesions EYES: conjunctiva are pink and  non-injected, sclera clear OROPHARYNX: no exudate, no erythema; lips, buccal mucosa, and tongue normal  NECK: supple, non-tender LYMPH:  no palpable lymphadenopathy in the cervical, axillary or supraclavicular lymph nodes.  LUNGS: clear to auscultation and percussion with normal breathing effort HEART: regular rate & rhythm and no murmurs and no lower extremity edema ABDOMEN: soft, non-tender, non-distended, normal bowel sounds Musculoskeletal: no cyanosis of digits and no clubbing  PSYCH: alert & oriented x 3, fluent speech NEURO: no focal motor/sensory deficits  LABORATORY DATA:  I have reviewed the data as listed CBC Latest Ref Rng & Units 11/25/2020 11/14/2020 09/18/2020  WBC 4.0 - 10.5 K/uL 6.9 6.7 8.7  Hemoglobin 12.0 - 15.0 g/dL 8.3(L) 7.8(L) 7.4(L)  Hematocrit 36.0 - 46.0 % 30.8(L) 29.3(L) 26.6(L)  Platelets 150 - 400 K/uL 379 1,139(HH) 818(HH)    CMP Latest Ref Rng & Units 09/18/2020  Glucose 65 - 99 mg/dL 75  BUN 6 - 20 mg/dL 9  Creatinine 4.69 - 6.29 mg/dL 5.28  Sodium 413 - 244 mmol/L 138  Potassium 3.5 - 5.2 mmol/L 4.6  Chloride 96 - 106 mmol/L 101  CO2 20 - 29 mmol/L 20  Calcium 8.7 - 10.2 mg/dL 9.2   ASSESSMENT & PLAN Deniya Craigo is a 20 y.o. female presenting to the clinic for evaluation for thrombocytosis. The likely cause of thrombocytosis is reactive secondary to iron deficiency anemia. Patient was recently started on ferrous sulfate 325 mg daily and transdermal hormonal patch last week. Based on recent iron panel on 11/14/2020, I recommend weekly IV feraheme x 2 doses. To be thorough, I will proceed with workup to rule out other causes of thrombocytosis including myeloproliferative disorders.   # Thrombocytosis: --Likely reactive secondary to severe iron deficiency anemia --Labs today to rule out other causes. This includes CBC, CRP, Retic Panel, MPN panel, BCR-ABL, Save Smear  # Iron deficiency anemia 2/2 menorrhagia: --Currently on ferrous sulfate 325 mg daily  and transdermal hormonal patch to reduce her menorrhagia. --Recommend IV feraheme x 2 doses.  --Provided list of iron rich foods to incorporate in diet --RTC 6 weeks after completing IV iron treatments.   Orders Placed This Encounter  Procedures  . CBC with Differential (Cancer Center Only)    Standing Status:   Future    Number of Occurrences:   1    Standing Expiration Date:   11/24/2021  . JAK2 (including V617F and Exon 12), MPL, and CALR-Next Generation Sequencing    Standing Status:   Future    Number of Occurrences:   1    Standing Expiration Date:   11/24/2021  . BCR ABL1 FISH ([employer]Path)    Standing Status:   Future    Number of Occurrences:   1    Standing Expiration Date:   11/24/2021  . Retic Panel    Standing Status:   Future    Number of Occurrences:   1    Standing Expiration Date:   11/24/2021  . C-reactive protein    Standing Status:   Future    Number of Occurrences:   1    Standing Expiration Date:   11/25/2021  . Save Smear (SSMR)    Standing Status:   Future    Number of Occurrences:   1    Standing Expiration Date:   11/25/2021    All questions were answered. The patient knows to call the clinic with any problems, questions or concerns.  A total of more than 60 minutes were spent on this encounter and over half of that time was spent on counseling and coordination of care as outlined above.    Sharon Kaufmann, PA-C Department of Hematology/Oncology [facility] Phone: [phone]

## 2020-11-26 ENCOUNTER — Telehealth: Payer: Self-pay | Admitting: Physician Assistant

## 2020-11-26 NOTE — Telephone Encounter (Signed)
Attempted to leave a message with follow-up appointments per 5/2 los. Voicemail not set up. Mailed calendar.

## 2020-12-03 LAB — BCR ABL1 FISH (GENPATH)

## 2020-12-03 LAB — JAK2 (INCLUDING V617F AND EXON 12), MPL,& CALR-NEXT GEN SEQ

## 2020-12-04 ENCOUNTER — Telehealth: Payer: Self-pay | Admitting: Physician Assistant

## 2020-12-04 NOTE — Telephone Encounter (Signed)
I called patient's mother, Sharon Gonzalez, as I was unable to reach Ms. Sharon Gonzalez and leave a voicemail. I reviewed the lab results after our consultation. Results indicate iron deficiency anemia. Platelet counts are back to normal. MPN panel and BCR-ABL were negative. Recommend to proceed with IV feraheme infusions that are scheduled for 12/06/2020 and 12/13/2020. I will plan to see the patient back in June with repeat labs. Ms. Sharon Gonzalez expressed understanding and satisfaction with the plan provided.

## 2020-12-06 ENCOUNTER — Inpatient Hospital Stay: Payer: Medicaid Other

## 2020-12-06 ENCOUNTER — Other Ambulatory Visit: Payer: Self-pay

## 2020-12-06 ENCOUNTER — Ambulatory Visit (HOSPITAL_BASED_OUTPATIENT_CLINIC_OR_DEPARTMENT_OTHER): Payer: Medicaid Other | Admitting: Medical

## 2020-12-06 VITALS — BP 117/65 | HR 78 | Temp 98.3°F | Resp 16

## 2020-12-06 DIAGNOSIS — D500 Iron deficiency anemia secondary to blood loss (chronic): Secondary | ICD-10-CM

## 2020-12-06 DIAGNOSIS — T8090XA Unspecified complication following infusion and therapeutic injection, initial encounter: Secondary | ICD-10-CM | POA: Diagnosis not present

## 2020-12-06 MED ORDER — FERUMOXYTOL INJECTION 510 MG/17 ML
510.0000 mg | Freq: Once | INTRAVENOUS | Status: AC
  Administered 2020-12-06: 510 mg via INTRAVENOUS
  Filled 2020-12-06: qty 510

## 2020-12-06 MED ORDER — FAMOTIDINE 20 MG IN NS 100 ML IVPB
20.0000 mg | Freq: Once | INTRAVENOUS | Status: AC | PRN
  Administered 2020-12-06: 20 mg via INTRAVENOUS

## 2020-12-06 MED ORDER — DIPHENHYDRAMINE HCL 50 MG/ML IJ SOLN
50.0000 mg | Freq: Once | INTRAMUSCULAR | Status: AC | PRN
  Administered 2020-12-06: 25 mg via INTRAVENOUS

## 2020-12-06 MED ORDER — SODIUM CHLORIDE 0.9 % IV SOLN
Freq: Once | INTRAVENOUS | Status: AC
Start: 2020-12-06 — End: 2020-12-06
  Filled 2020-12-06: qty 250

## 2020-12-06 NOTE — Patient Instructions (Signed)
Ferumoxytol injection What is this medicine? FERUMOXYTOL is an iron complex. Iron is used to make healthy red blood cells, which carry oxygen and nutrients throughout the body. This medicine is used to treat iron deficiency anemia. This medicine may be used for other purposes; ask your health care provider or pharmacist if you have questions. COMMON BRAND NAME(S): Feraheme What should I tell my health care provider before I take this medicine? They need to know if you have any of these conditions:  anemia not caused by low iron levels  high levels of iron in the blood  magnetic resonance imaging (MRI) test scheduled  an unusual or allergic reaction to iron, other medicines, foods, dyes, or preservatives  pregnant or trying to get pregnant  breast-feeding How should I use this medicine? This medicine is for injection into a vein. It is given by a health care professional in a hospital or clinic setting. Talk to your pediatrician regarding the use of this medicine in children. Special care may be needed. Overdosage: If you think you have taken too much of this medicine contact a poison control center or emergency room at once. NOTE: This medicine is only for you. Do not share this medicine with others. What if I miss a dose? It is important not to miss your dose. Call your doctor or health care professional if you are unable to keep an appointment. What may interact with this medicine? This medicine may interact with the following medications:  other iron products This list may not describe all possible interactions. Give your health care provider a list of all the medicines, herbs, non-prescription drugs, or dietary supplements you use. Also tell them if you smoke, drink alcohol, or use illegal drugs. Some items may interact with your medicine. What should I watch for while using this medicine? Visit your doctor or healthcare professional regularly. Tell your doctor or healthcare  professional if your symptoms do not start to get better or if they get worse. You may need blood work done while you are taking this medicine. You may need to follow a special diet. Talk to your doctor. Foods that contain iron include: whole grains/cereals, dried fruits, beans, or peas, leafy green vegetables, and organ meats (liver, kidney). What side effects may I notice from receiving this medicine? Side effects that you should report to your doctor or health care professional as soon as possible:  allergic reactions like skin rash, itching or hives, swelling of the face, lips, or tongue  breathing problems  changes in blood pressure  feeling faint or lightheaded, falls  fever or chills  flushing, sweating, or hot feelings  swelling of the ankles or feet Side effects that usually do not require medical attention (report to your doctor or health care professional if they continue or are bothersome):  diarrhea  headache  nausea, vomiting  stomach pain This list may not describe all possible side effects. Call your doctor for medical advice about side effects. You may report side effects to FDA at 1-800-FDA-1088. Where should I keep my medicine? This drug is given in a hospital or clinic and will not be stored at home. NOTE: This sheet is a summary. It may not cover all possible information. If you have questions about this medicine, talk to your doctor, pharmacist, or health care provider.  2021 Elsevier/Gold Standard (2016-08-31 20:21:10)  

## 2020-12-06 NOTE — Progress Notes (Signed)
Approximately 5 minutes into feraheme infusion, pt c/o shortness of breath, cough, and dizziness.  Infusion paused.  NS ran wide open.  VSS.  Pepcid given at 0900 and Benadryl 25mg  given 0912.  Pt started feeling better almost immediately after infusion paused.  , PA to bedside.  Instructed to restart infusion at approximately 0925.  Pt tolerated remainder of infusion well.  Observed 30 minutes post infusion.  VSS.  No complaints.

## 2020-12-12 NOTE — Progress Notes (Signed)
     DATE:  12/06/2020                                         X  INFUSION REACTION     PROVIDER: Elita Quick, PA-C   AGENT/BLOOD PRODUCT RECEIVING TODAY:              Feraheme   AGENT/BLOOD PRODUCT RECEIVING IMMEDIATELY PRIOR TO REACTION:          Feraheme   VS: BP:     129/79   P:       70       SPO2:       100 % on room air                  REACTION(S):      SOB, cough and dizziness       PREMEDS:     none   INTERVENTION: Infusion paused and patient given Pepcid 20 mg IV and Benadryl 25 mg IN   Review of Systems  Review of Systems  Constitutional: Negative for chills, diaphoresis and fever.  HENT: Negative for trouble swallowing and voice change.   Respiratory: Positive for cough and shortness of breath. Negative for chest tightness and wheezing.   Cardiovascular: Negative for chest pain and palpitations.  Gastrointestinal: Negative for abdominal pain, constipation, diarrhea, nausea and vomiting.  Musculoskeletal: Negative for back pain and myalgias.  Neurological: Positive for dizziness. Negative for light-headedness and headaches.     Physical Exam  Physical Exam Constitutional:      General: She is not in acute distress.    Appearance: She is not diaphoretic.  HENT:     Head: Normocephalic and atraumatic.  Cardiovascular:     Rate and Rhythm: Normal rate and regular rhythm.     Heart sounds: Normal heart sounds. No murmur heard. No friction rub. No gallop.   Pulmonary:     Effort: Pulmonary effort is normal. No respiratory distress.     Breath sounds: Normal breath sounds. No wheezing or rales.  Skin:    General: Skin is warm and dry.     Findings: No erythema or rash.  Neurological:     Mental Status: She is alert.     OUTCOME:                Feraheme was restarted and completed without additional issues of concern.   Marga Hoots, MHS, PA-C

## 2020-12-13 ENCOUNTER — Inpatient Hospital Stay: Payer: Medicaid Other

## 2020-12-13 ENCOUNTER — Other Ambulatory Visit: Payer: Self-pay

## 2020-12-13 ENCOUNTER — Encounter: Payer: Self-pay | Admitting: Pharmacist

## 2020-12-13 VITALS — BP 122/65 | HR 84 | Temp 98.7°F | Resp 16

## 2020-12-13 DIAGNOSIS — D500 Iron deficiency anemia secondary to blood loss (chronic): Secondary | ICD-10-CM | POA: Diagnosis not present

## 2020-12-13 MED ORDER — SODIUM CHLORIDE 0.9 % IV SOLN
510.0000 mg | Freq: Once | INTRAVENOUS | Status: AC
  Administered 2020-12-13: 510 mg via INTRAVENOUS
  Filled 2020-12-13: qty 510

## 2020-12-13 MED ORDER — FAMOTIDINE 20 MG IN NS 100 ML IVPB
INTRAVENOUS | Status: AC
  Filled 2020-12-13: qty 100

## 2020-12-13 MED ORDER — DIPHENHYDRAMINE HCL 50 MG/ML IJ SOLN
INTRAMUSCULAR | Status: AC
  Filled 2020-12-13: qty 1

## 2020-12-13 MED ORDER — FAMOTIDINE 20 MG IN NS 100 ML IVPB
20.0000 mg | Freq: Once | INTRAVENOUS | Status: AC
  Administered 2020-12-13: 20 mg via INTRAVENOUS

## 2020-12-13 MED ORDER — DIPHENHYDRAMINE HCL 50 MG/ML IJ SOLN
25.0000 mg | Freq: Once | INTRAMUSCULAR | Status: AC
  Administered 2020-12-13: 25 mg via INTRAVENOUS

## 2020-12-13 MED ORDER — SODIUM CHLORIDE 0.9 % IV SOLN
Freq: Once | INTRAVENOUS | Status: AC
Start: 2020-12-13 — End: 2020-12-13
  Filled 2020-12-13: qty 250

## 2020-12-13 NOTE — Patient Instructions (Signed)
Ferumoxytol injection What is this medicine? FERUMOXYTOL is an iron complex. Iron is used to make healthy red blood cells, which carry oxygen and nutrients throughout the body. This medicine is used to treat iron deficiency anemia. This medicine may be used for other purposes; ask your health care provider or pharmacist if you have questions. COMMON BRAND NAME(S): Feraheme What should I tell my health care provider before I take this medicine? They need to know if you have any of these conditions:  anemia not caused by low iron levels  high levels of iron in the blood  magnetic resonance imaging (MRI) test scheduled  an unusual or allergic reaction to iron, other medicines, foods, dyes, or preservatives  pregnant or trying to get pregnant  breast-feeding How should I use this medicine? This medicine is for injection into a vein. It is given by a health care professional in a hospital or clinic setting. Talk to your pediatrician regarding the use of this medicine in children. Special care may be needed. Overdosage: If you think you have taken too much of this medicine contact a poison control center or emergency room at once. NOTE: This medicine is only for you. Do not share this medicine with others. What if I miss a dose? It is important not to miss your dose. Call your doctor or health care professional if you are unable to keep an appointment. What may interact with this medicine? This medicine may interact with the following medications:  other iron products This list may not describe all possible interactions. Give your health care provider a list of all the medicines, herbs, non-prescription drugs, or dietary supplements you use. Also tell them if you smoke, drink alcohol, or use illegal drugs. Some items may interact with your medicine. What should I watch for while using this medicine? Visit your doctor or healthcare professional regularly. Tell your doctor or healthcare  professional if your symptoms do not start to get better or if they get worse. You may need blood work done while you are taking this medicine. You may need to follow a special diet. Talk to your doctor. Foods that contain iron include: whole grains/cereals, dried fruits, beans, or peas, leafy green vegetables, and organ meats (liver, kidney). What side effects may I notice from receiving this medicine? Side effects that you should report to your doctor or health care professional as soon as possible:  allergic reactions like skin rash, itching or hives, swelling of the face, lips, or tongue  breathing problems  changes in blood pressure  feeling faint or lightheaded, falls  fever or chills  flushing, sweating, or hot feelings  swelling of the ankles or feet Side effects that usually do not require medical attention (report to your doctor or health care professional if they continue or are bothersome):  diarrhea  headache  nausea, vomiting  stomach pain This list may not describe all possible side effects. Call your doctor for medical advice about side effects. You may report side effects to FDA at 1-800-FDA-1088. Where should I keep my medicine? This drug is given in a hospital or clinic and will not be stored at home. NOTE: This sheet is a summary. It may not cover all possible information. If you have questions about this medicine, talk to your doctor, pharmacist, or health care provider.  2021 Elsevier/Gold Standard (2016-08-31 20:21:10)  

## 2020-12-13 NOTE — Progress Notes (Signed)
Patient was observed for 30 minutes post infusion with no complications. Vitals stable and patient in no distress upon leaving infusion clinic.  

## 2021-01-06 ENCOUNTER — Telehealth: Payer: Self-pay | Admitting: Physician Assistant

## 2021-01-06 ENCOUNTER — Other Ambulatory Visit: Payer: Self-pay | Admitting: Physician Assistant

## 2021-01-06 DIAGNOSIS — D500 Iron deficiency anemia secondary to blood loss (chronic): Secondary | ICD-10-CM

## 2021-01-06 NOTE — Telephone Encounter (Signed)
R/s 6/14 appt due to provider being out of office. Called patient. Not able to leave msg. Will attempt to call again

## 2021-01-07 ENCOUNTER — Inpatient Hospital Stay: Payer: Medicaid Other | Admitting: Physician Assistant

## 2021-01-07 ENCOUNTER — Inpatient Hospital Stay: Payer: Medicaid Other

## 2021-01-10 ENCOUNTER — Inpatient Hospital Stay: Payer: Medicaid Other

## 2021-01-10 ENCOUNTER — Inpatient Hospital Stay: Payer: Medicaid Other | Admitting: Physician Assistant

## 2021-01-13 ENCOUNTER — Telehealth: Payer: Self-pay | Admitting: Physician Assistant

## 2021-01-13 NOTE — Telephone Encounter (Signed)
Called pt to r/s appt per 6/17 sch msg. No answer and no vm set up. Will call pt again tomorrow.

## 2021-01-14 ENCOUNTER — Telehealth: Payer: Self-pay | Admitting: Physician Assistant

## 2021-01-14 NOTE — Telephone Encounter (Signed)
Attempted to call pt again to r/s appts per 6/17 sch msg. No answer and no vm set up.

## 2021-01-15 ENCOUNTER — Telehealth: Payer: Self-pay | Admitting: Physician Assistant

## 2021-01-15 NOTE — Telephone Encounter (Signed)
Attempted to call pt multiple times, no answer and no vm available. R/s appt for July and mailed updated calendar to pt.

## 2021-01-21 ENCOUNTER — Encounter: Payer: Self-pay | Admitting: Nurse Practitioner

## 2021-01-22 ENCOUNTER — Other Ambulatory Visit: Payer: Self-pay

## 2021-01-22 ENCOUNTER — Encounter: Payer: Self-pay | Admitting: Nurse Practitioner

## 2021-01-22 ENCOUNTER — Ambulatory Visit: Payer: Medicaid Other | Attending: Nurse Practitioner | Admitting: Nurse Practitioner

## 2021-01-22 DIAGNOSIS — N946 Dysmenorrhea, unspecified: Secondary | ICD-10-CM | POA: Diagnosis not present

## 2021-01-22 DIAGNOSIS — L309 Dermatitis, unspecified: Secondary | ICD-10-CM | POA: Diagnosis not present

## 2021-01-22 DIAGNOSIS — F419 Anxiety disorder, unspecified: Secondary | ICD-10-CM

## 2021-01-22 DIAGNOSIS — Z30011 Encounter for initial prescription of contraceptive pills: Secondary | ICD-10-CM

## 2021-01-22 MED ORDER — LEVONORG-ETH ESTRAD TRIPHASIC 50-30/75-40/ 125-30 MCG PO TABS
1.0000 | ORAL_TABLET | Freq: Every day | ORAL | 11 refills | Status: DC
Start: 2021-01-22 — End: 2022-05-19

## 2021-01-22 MED ORDER — IBUPROFEN 800 MG PO TABS: ORAL_TABLET | ORAL | 1 refills | Status: DC

## 2021-01-22 MED ORDER — HYDROXYZINE HCL 10 MG PO TABS: 10.0000 mg | ORAL_TABLET | Freq: Three times a day (TID) | ORAL | 1 refills | Status: DC | PRN

## 2021-01-22 MED ORDER — TRIAMCINOLONE ACETONIDE 0.1 % EX CREA: TOPICAL_CREAM | CUTANEOUS | 3 refills | Status: DC

## 2021-01-22 NOTE — Progress Notes (Signed)
Virtual Visit via Telephone Note Due to national recommendations of social distancing due to COVID 19, telehealth visit is felt to be most appropriate for this patient at this time.  I discussed the limitations, risks, security and privacy concerns of performing an evaluation and management service by telephone and the availability of in person appointments. I also discussed with the patient that there may be a patient responsible charge related to this service. The patient expressed understanding and agreed to proceed.    I connected with Sharon Gonzalez on 01/22/21  at   2:30 PM EDT  EDT by telephone and verified that I am speaking with the correct person using two identifiers.  Location of Patient: Private Residence   [facility] of Provider: [facility]    Persons participating in Telemedicine visit: Bertram Denver FNP-BC Sharon Gonzalez    History of Present Illness: Telemedicine visit for: Anxiety  Anxiety or Depression Notes mood lability, anhedonia. Sometimes she is happy and then will get sad and anxious on the days. She has social anxiety as well. Sometimes feels panicky at school when she is asked to perform a skill or around her classmates. Difficult focusing. Constantly worrying.  Depression screen [facility] 2/9 11/11/2020 03/05/2020  Decreased Interest 0 1  Down, Depressed, Hopeless 0 0  PHQ - 2 Score 0 1  Altered sleeping 0 0  Tired, decreased energy 0 0  Change in appetite 0 0  Feeling bad or failure about yourself  0 0  Trouble concentrating 0 0  Moving slowly or fidgety/restless 0 0  Suicidal thoughts 0 0  PHQ-9 Score 0 1    GAD 7 : Generalized Anxiety Score 11/11/2020 03/05/2020  Nervous, Anxious, on Edge 0 0  Control/stop worrying 0 0  Worry too much - different things 0 0  Trouble relaxing 0 0  Restless 0 0  Easily annoyed or irritable 0 0  Afraid - awful might happen 0 0  Total GAD 7 Score 0 0     OCP Still with heavy cycles despite  using the transdermal patch for birth control. Would like to try the pill.  Past Medical History:  Diagnosis Date   Acne    Allergy    seasonal allergies   Anxiety    Breast discharge 08/30/14   Enuresis    Vaginal discharge 08/30/14    Past Surgical History:  Procedure Laterality Date   NO PAST SURGERIES      Family History  Problem Relation Age of Onset   Asthma Mother    Hyperlipidemia Mother    Iron deficiency Mother    Asthma Sister    Diabetes Maternal Aunt        great aunt   Stroke Maternal Aunt        great aunt    Social History   Socioeconomic History   Marital status: Single    Spouse name: Not on file   Number of children: Not on file   Years of education: Not on file   Highest education level: Not on file  Occupational History   Not on file  Tobacco Use   Smoking status: Never   Smokeless tobacco: Never  Vaping Use   Vaping Use: Never used  Substance and Sexual Activity   Alcohol use: Not Currently   Drug use: Not Currently   Sexual activity: Not Currently    Birth control/protection: None  Other Topics Concern   Not on file  Social History Narrative  Lives with mother, step-father, 2 sisters and 2 brothers   Social Determinants of Health   Financial Resource Strain: Not on file  Food Insecurity: Not on file  Transportation Needs: Not on file  Physical Activity: Not on file  Stress: Not on file  Social Connections: Not on file     Observations/Objective: Awake, alert and oriented x 3   Review of Systems  Constitutional:  Negative for fever, malaise/fatigue and weight loss.  HENT: Negative.  Negative for nosebleeds.   Eyes: Negative.  Negative for blurred vision, double vision and photophobia.  Respiratory: Negative.  Negative for cough and shortness of breath.   Cardiovascular: Negative.  Negative for chest pain, palpitations and leg swelling.  Gastrointestinal: Negative.  Negative for heartburn, nausea and vomiting.  Musculoskeletal:  Negative.  Negative for myalgias.  Neurological: Negative.  Negative for dizziness, focal weakness, seizures and headaches.  Psychiatric/Behavioral:  Negative for suicidal ideas. The patient is nervous/anxious.    Assessment and Plan: Diagnoses and all orders for this visit:  Initiation of oral contraception -     Levonorg-Eth Estrad Triphasic (LEVONEST) 50-30/75-40/ 125-30 MCG TABS; Take 1 tablet by mouth daily.  Eczema, unspecified type -     triamcinolone cream (KENALOG) 0.1 %; APPLY TO ECZEMA RASH 3 TIMES DAILY FOR FLARE UPS  Dysmenorrhea -     ibuprofen (ADVIL) 800 MG tablet; TAKE 1 TABLET BY MOUTH AT THE SIGN OF CRAMPS THEN 6 EVERY AS NEEDED FOR PAIN  Anxiety -     hydrOXYzine (ATARAX/VISTARIL) 10 MG tablet; Take 1 tablet (10 mg total) by mouth 3 (three) times daily as needed.    Follow Up Instructions Return in about 8 weeks (around 03/19/2021) for anxiety.     I discussed the assessment and treatment plan with the patient. The patient was provided an opportunity to ask questions and all were answered. The patient agreed with the plan and demonstrated an understanding of the instructions.   The patient was advised to call back or seek an in-person evaluation if the symptoms worsen or if the condition fails to improve as anticipated.  I provided 18 minutes of non-face-to-face time during this encounter including median intraservice time, reviewing previous notes, labs, imaging, medications and explaining diagnosis and management.  Claiborne Rigg, FNP-BC

## 2021-02-10 ENCOUNTER — Other Ambulatory Visit: Payer: Self-pay | Admitting: Physician Assistant

## 2021-02-11 ENCOUNTER — Telehealth: Payer: Self-pay

## 2021-02-11 ENCOUNTER — Inpatient Hospital Stay: Payer: Medicaid Other | Admitting: Physician Assistant

## 2021-02-11 ENCOUNTER — Inpatient Hospital Stay: Payer: Medicaid Other | Attending: Physician Assistant

## 2021-02-11 ENCOUNTER — Telehealth: Payer: Self-pay | Admitting: Physician Assistant

## 2021-02-11 NOTE — Telephone Encounter (Signed)
I tried to contact the patient today regarding her follow up appointment with Georga Kaufmann, PA. The phone rang with no option to leave a VM. I have sent a message to scheduling to follow up with her in case she would like to reschedule.

## 2021-02-11 NOTE — Telephone Encounter (Signed)
Called pt to r/s appts per 7/19 sch msg. No answer and no vm available. Will try to call pt again tomorrow.

## 2021-02-12 ENCOUNTER — Telehealth: Payer: Self-pay | Admitting: Physician Assistant

## 2021-02-12 NOTE — Telephone Encounter (Signed)
Called pt again today to r/s missed appts. No answer and no vm was available to leave a msg.

## 2022-04-27 ENCOUNTER — Telehealth: Payer: Medicaid Other | Admitting: Physician Assistant

## 2022-04-27 ENCOUNTER — Ambulatory Visit: Payer: Medicaid Other | Admitting: Nurse Practitioner

## 2022-04-27 ENCOUNTER — Telehealth: Payer: Medicaid Other | Admitting: Nurse Practitioner

## 2022-04-27 DIAGNOSIS — Z79899 Other long term (current) drug therapy: Secondary | ICD-10-CM

## 2022-04-27 NOTE — Progress Notes (Signed)
Because of needing medication refills, but wanting to change, I feel your condition warrants further evaluation and I recommend that you be seen for a face to face visit.  Please contact your primary care physician practice to be seen. Many offices offer virtual options to be seen via video if you are not comfortable going in person to a medical facility at this time.  If you are wanting a change in a chronic medication please contact the prescribing office and schedule an appointment. Offices are offering some virtual video appointments, but you will need to contact them by phone to be scheduled for one of those appointments.  NOTE: You will NOT be charged for this eVisit.  If you do not have a PCP, [facility] offers a free physician referral service available at [phone]. Our trained staff has the experience, knowledge and resources to put you in touch with a physician who is right for you.    If you are having a true medical emergency please call 911.   Your e-visit answers were reviewed by a board certified advanced clinical practitioner to complete your personal care plan.  Thank you for using e-Visits.  I provided 5 minutes of non face-to-face time during this encounter for chart review and documentation.

## 2022-04-30 ENCOUNTER — Ambulatory Visit: Payer: Self-pay

## 2022-04-30 NOTE — Telephone Encounter (Signed)
  Chief Complaint: Discomfort at the end of urination, Frequency Symptoms: ibid Frequency: Sunday Pertinent Negatives: Patient denies Fever, flank pain Disposition: [] ED /[x] Urgent Care (no appt availability in office) / [] Appointment(In office/virtual)/ []  [facility]/ [] Home Care/ [] Refused Recommended Disposition /[] [facility]/ []  Follow-up with PCP Additional Notes: PT has had pain at the end of urination, and frequency, urgency since Sunday. Pt will go to UC on Saturday. Pt is unable to go before then.    Summary: Pt requests call back from a nurse due to pain and discomfort in private area   Pt requests to speak with a nurse. Pt stated she is experiencing pain and discomfort after urination. Pt stated it feels as though her vagina is coming out and this all began last Sunday after having sexual intercourse. Cb# [phone]      Reason for Disposition  Urinating more frequently than usual (i.e., frequency)  Answer Assessment - Initial Assessment Questions 1. SYMPTOM: "What's the main symptom you're concerned about?" (e.g., frequency, incontinence)     Pain at the end of urination 2. ONSET: "When did the  Pain  start?"     Sunday 3. PAIN: "Is there any pain?" If Yes, ask: "How bad is it?" (Scale: 1-10; mild, moderate, severe)     Yes 8/10 4. CAUSE: "What do you think is causing the symptoms?"     Unsure 5. OTHER SYMPTOMS: "Do you have any other symptoms?" (e.g., blood in urine, fever, flank pain, pain with urination)  Pink when wiping     6. PREGNANCY: "Is there any chance you are pregnant?" "When was your last menstrual period?"     no  Protocols used: Urinary Symptoms-A-AH

## 2022-05-01 NOTE — Telephone Encounter (Signed)
Attempt to call patient to schedule apt at [facility] on Monday. No answer or voicemail.

## 2022-05-01 NOTE — Telephone Encounter (Signed)
Patient going to [facility] tomorrow.

## 2022-05-19 ENCOUNTER — Encounter: Payer: Self-pay | Admitting: Nurse Practitioner

## 2022-05-19 ENCOUNTER — Other Ambulatory Visit: Payer: Self-pay | Admitting: Nurse Practitioner

## 2022-05-19 ENCOUNTER — Ambulatory Visit: Payer: Medicaid Other | Attending: Nurse Practitioner | Admitting: *Deleted

## 2022-05-19 DIAGNOSIS — Z30011 Encounter for initial prescription of contraceptive pills: Secondary | ICD-10-CM

## 2022-05-19 DIAGNOSIS — Z3009 Encounter for other general counseling and advice on contraception: Secondary | ICD-10-CM

## 2022-05-19 DIAGNOSIS — L309 Dermatitis, unspecified: Secondary | ICD-10-CM

## 2022-05-19 DIAGNOSIS — N946 Dysmenorrhea, unspecified: Secondary | ICD-10-CM

## 2022-05-19 LAB — POCT URINE PREGNANCY: Preg Test, Ur: NEGATIVE

## 2022-05-19 MED ORDER — IBUPROFEN 800 MG PO TABS: ORAL_TABLET | ORAL | 1 refills | Status: DC

## 2022-05-19 MED ORDER — TRIAMCINOLONE ACETONIDE 0.1 % EX CREA: TOPICAL_CREAM | CUTANEOUS | 3 refills | Status: DC

## 2022-05-19 MED ORDER — LEVONORG-ETH ESTRAD TRIPHASIC 50-30/75-40/ 125-30 MCG PO TABS: 1.0000 | ORAL_TABLET | Freq: Every day | ORAL | 11 refills | Status: DC

## 2022-05-25 NOTE — Progress Notes (Signed)
ERRRONEOUS Sharon Gonzalez

## 2022-10-23 ENCOUNTER — Encounter: Payer: Self-pay | Admitting: Nurse Practitioner

## 2022-10-23 ENCOUNTER — Telehealth: Payer: Medicaid Other | Admitting: Physician Assistant

## 2022-10-23 ENCOUNTER — Other Ambulatory Visit: Payer: Self-pay | Admitting: Nurse Practitioner

## 2022-10-23 DIAGNOSIS — L700 Acne vulgaris: Secondary | ICD-10-CM | POA: Diagnosis not present

## 2022-10-23 MED ORDER — BENZOYL PEROXIDE-ERYTHROMYCIN 5-3 % EX GEL: Freq: Two times a day (BID) | CUTANEOUS | 0 refills | Status: DC

## 2022-10-23 MED ORDER — DOXYCYCLINE HYCLATE 100 MG PO TABS: 100.0000 mg | ORAL_TABLET | Freq: Two times a day (BID) | ORAL | 0 refills | Status: DC

## 2022-10-23 NOTE — Progress Notes (Signed)
E-Visit for Acne  We are sorry that you are experiencing this issue.  Here is how we plan to help!  Based on what you shared with me it looks like you have severe acne.  Acne is a disorder of the hair follicles and oil glands (sebaceous glands). The sebaceous glands secrete oils to keep the skin moist.  When the glands get clogged, it can lead to pimples or cysts.  These cysts may become infected and leave scars. Acne is very common and normally occurs at puberty.  Acne is also inherited.  Your personal care plan consists of the following recommendations:  STOP using Triamcinolone on your face.  I recommend that you use a daily cleanser  You may try a topical exfoliator and salicylic acid scrub.  These scrubs have coarse particles that clear your pores but may also irritate your skin.  I have prescribed a topical gel with an antibiotic:  Benzoyl peroxide-erythromycin gel.  This gel should be applied to the affected areas twice a day. Be sure to read the package insert for potential side effects.  I have also prescribed one of the following additional therapies:  Doxycycline an oral antibiotic 100 mg twice a day for 10 days  If excessive dryness or peeling occurs, reduce dose frequency or concentration of the topical scrubs.  If excessive stinging or burning occurs, remove the topical gel with mild soap and water and resume at a lower dose the next day.  Remember oral antibiotics and topical acne treatments may increase your sensitivity to the sun!  HOME CARE: Do not squeeze pimples because that can often lead to infections, worse acne, and scars. Use a moisturizer that contains retinoid or fruit acids that may inhibit the development of new acne lesions. Although there is not a clear link that foods can cause acne, doctors do believe that too many sweets predispose you to skin problems.  GET HELP RIGHT AWAY IF: If your acne gets worse or is not better within 10 days. If you become  depressed. If you become pregnant, discontinue medications and call your OB/GYN.  MAKE SURE YOU: Understand these instructions. Will watch your condition. Will get help right away if you are not doing well or get worse.  Thank you for choosing an e-visit.  Your e-visit answers were reviewed by a board certified advanced clinical practitioner to complete your personal care plan. Depending upon the condition, your plan could have included both over the counter or prescription medications.  Please review your pharmacy choice. Make sure the pharmacy is open so you can pick up prescription now. If there is a problem, you may contact your provider through [employer] and have the prescription routed to another pharmacy.  Your safety is important to [place]. If you have drug allergies check your prescription carefully.   For the next 24 hours you can use MyChart to ask questions about today's visit, request a non-urgent call back, or ask for a work or school excuse. You will get an email in the next two days asking about your experience. I hope that your e-visit has been valuable and will speed your recovery.  I have spent 5 minutes in review of e-visit questionnaire, review and updating patient chart, medical decision making and response to patient.   Mar Daring, PA-C

## 2022-11-06 ENCOUNTER — Encounter: Payer: Self-pay | Admitting: Physician Assistant

## 2022-11-06 ENCOUNTER — Other Ambulatory Visit: Payer: Self-pay | Admitting: Physician Assistant

## 2022-11-06 DIAGNOSIS — L700 Acne vulgaris: Secondary | ICD-10-CM

## 2022-11-07 MED ORDER — BENZOYL PEROXIDE-ERYTHROMYCIN 5-3 % EX GEL: Freq: Two times a day (BID) | CUTANEOUS | 0 refills | Status: DC

## 2022-12-15 ENCOUNTER — Other Ambulatory Visit: Payer: Self-pay | Admitting: Nurse Practitioner

## 2022-12-15 DIAGNOSIS — L700 Acne vulgaris: Secondary | ICD-10-CM

## 2022-12-16 MED ORDER — BENZOYL PEROXIDE-ERYTHROMYCIN 5-3 % EX GEL: Freq: Two times a day (BID) | CUTANEOUS | 0 refills | Status: DC

## 2023-01-19 ENCOUNTER — Other Ambulatory Visit: Payer: Self-pay | Admitting: Nurse Practitioner

## 2023-01-19 ENCOUNTER — Other Ambulatory Visit: Payer: Self-pay | Admitting: Physician Assistant

## 2023-01-19 DIAGNOSIS — L700 Acne vulgaris: Secondary | ICD-10-CM

## 2023-01-20 MED ORDER — BENZOYL PEROXIDE-ERYTHROMYCIN 5-3 % EX GEL: Freq: Two times a day (BID) | CUTANEOUS | 0 refills | Status: DC

## 2023-02-17 ENCOUNTER — Telehealth: Payer: Medicaid Other | Admitting: Family Medicine

## 2023-02-17 DIAGNOSIS — L709 Acne, unspecified: Secondary | ICD-10-CM

## 2023-02-17 MED ORDER — KETOCONAZOLE 2 % EX CREA: 1.0000 | TOPICAL_CREAM | Freq: Every day | CUTANEOUS | 0 refills | Status: DC

## 2023-02-17 NOTE — Progress Notes (Signed)
E-Visit for Acne  We are sorry that you are experiencing this issue.  Here is how we plan to help!  Based on what you shared with me it looks like you have uncomplicated acne.  Acne is a disorder of the hair follicles and oil glands (sebaceous glands). The sebaceous glands secrete oils to keep the skin moist.  When the glands get clogged, it can lead to pimples or cysts.  These cysts may become infected and leave scars. Acne is very common and normally occurs at puberty.  Acne is also inherited.  Your personal care plan consists of the following recommendations:  I recommend that you use a daily cleanser  You might try 2% topical salicylic acid pads or wipes.  Use the pads to daily cleanse your skin.  I have prescribed a topical gel with an antibiotic:  Ketoconazole Cream apply once daily for 7 days and use as needed until clear- refills will need to be from your primary care.   If excessive dryness or peeling occurs, reduce dose frequency or concentration of the topical scrubs.  If excessive stinging or burning occurs, remove the topical gel with mild soap and water and resume at a lower dose the next day.  Remember oral antibiotics and topical acne treatments may increase your sensitivity to the sun!  HOME CARE: Do not squeeze pimples because that can often lead to infections, worse acne, and scars. Use a moisturizer that contains retinoid or fruit acids that may inhibit the development of new acne lesions. Although there is not a clear link that foods can cause acne, doctors do believe that too many sweets predispose you to skin problems.  GET HELP RIGHT AWAY IF: If your acne gets worse or is not better within 10 days. If you become depressed. If you become pregnant, discontinue medications and call your OB/GYN.  MAKE SURE YOU: Understand these instructions. Will watch your condition. Will get help right away if you are not doing well or get worse.  Thank you for choosing an  e-visit.  Your e-visit answers were reviewed by a board certified advanced clinical practitioner to complete your personal care plan. Depending upon the condition, your plan could have included both over the counter or prescription medications.  Please review your pharmacy choice. Make sure the pharmacy is open so you can pick up prescription now. If there is a problem, you may contact your provider through [employer] and have the prescription routed to another pharmacy.  Your safety is important to [place]. If you have drug allergies check your prescription carefully.   For the next 24 hours you can use MyChart to ask questions about today's visit, request a non-urgent call back, or ask for a work or school excuse. You will get an email in the next two days asking about your experience. I hope that your e-visit has been valuable and will speed your recovery.  I provided 5 minutes of non face-to-face time during this encounter for chart review, medication and order placement, as well as and documentation.

## 2023-06-21 ENCOUNTER — Ambulatory Visit: Payer: Medicaid Other | Admitting: Dermatology

## 2023-07-01 ENCOUNTER — Telehealth: Payer: Medicaid Other | Admitting: Physician Assistant

## 2023-07-01 DIAGNOSIS — R6889 Other general symptoms and signs: Secondary | ICD-10-CM | POA: Diagnosis not present

## 2023-07-01 DIAGNOSIS — R42 Dizziness and giddiness: Secondary | ICD-10-CM | POA: Diagnosis not present

## 2023-07-01 DIAGNOSIS — H6503 Acute serous otitis media, bilateral: Secondary | ICD-10-CM

## 2023-07-01 MED ORDER — AMOXICILLIN-POT CLAVULANATE 875-125 MG PO TABS: 1.0000 | ORAL_TABLET | Freq: Two times a day (BID) | ORAL | 0 refills | Status: DC

## 2023-07-01 MED ORDER — MECLIZINE HCL 12.5 MG PO TABS: 12.5000 mg | ORAL_TABLET | Freq: Three times a day (TID) | ORAL | 0 refills | Status: DC | PRN

## 2023-07-01 NOTE — Patient Instructions (Signed)
Verlon Au, thank you for joining Margaretann Loveless, PA-C for today's virtual visit.  While this provider is not your primary care provider (PCP), if your PCP is located in our provider database this encounter information will be shared with them immediately following your visit.   A [facility] MyChart account gives you access to today's visit and all your visits, tests, and labs performed at [facility] " click here if you don't have a [facility] MyChart account or go to [email or web address]  Consent: (Patient) Sharon Gonzalez provided verbal consent for this virtual visit at the beginning of the encounter.  Current Medications:  Current Outpatient Medications:    amoxicillin-clavulanate (AUGMENTIN) 875-125 MG tablet, Take 1 tablet by mouth 2 (two) times daily., Disp: 20 tablet, Rfl: 0   meclizine (ANTIVERT) 12.5 MG tablet, Take 1-2 tablets (12.5-25 mg total) by mouth 3 (three) times daily as needed for dizziness., Disp: 30 tablet, Rfl: 0   benzoyl peroxide-erythromycin (BENZAMYCIN) gel, Apply topically 2 (two) times daily., Disp: 23.3 g, Rfl: 0   doxycycline (VIBRA-TABS) 100 MG tablet, Take 1 tablet (100 mg total) by mouth 2 (two) times daily., Disp: 20 tablet, Rfl: 0   ferrous sulfate 324 (65 Fe) MG TBEC, Take 1 tablet (325 mg total) by mouth daily. May take every other day if causes constipation, Disp: 90 tablet, Rfl: 1   hydrOXYzine (ATARAX/VISTARIL) 10 MG tablet, Take 1 tablet (10 mg total) by mouth 3 (three) times daily as needed., Disp: 60 tablet, Rfl: 1   ibuprofen (ADVIL) 800 MG tablet, TAKE 1 TABLET BY MOUTH AT THE SIGN OF CRAMPS THEN 6 EVERY AS NEEDED FOR PAIN, Disp: 60 tablet, Rfl: 1   ketoconazole (NIZORAL) 2 % cream, Apply 1 Application topically daily., Disp: 15 g, Rfl: 0   Levonorg-Eth Estrad Triphasic (LEVONEST) 50-30/75-40/ 125-30 MCG TABS, Take 1 tablet by mouth daily., Disp: 28 tablet, Rfl: 11   polyethylene glycol powder (GLYCOLAX/MIRALAX) 17 GM/SCOOP  powder, Take one capful dissolved in 16 oz of water daily., Disp: 3350 g, Rfl: 1   triamcinolone cream (KENALOG) 0.1 %, APPLY TO ECZEMA RASH 3 TIMES DAILY FOR FLARE UPS, Disp: 240 g, Rfl: 3   Vitamin D, Ergocalciferol, (DRISDOL) 1.25 MG (50000 UNIT) CAPS capsule, Take 1 capsule (50,000 Units total) by mouth every 7 (seven) days., Disp: 12 capsule, Rfl: 1   Medications ordered in this encounter:  Meds ordered this encounter  Medications   amoxicillin-clavulanate (AUGMENTIN) 875-125 MG tablet    Sig: Take 1 tablet by mouth 2 (two) times daily.    Dispense:  20 tablet    Refill:  0    Order Specific Question:   Supervising Provider    Answer:   Merrilee Jansky [[id number]]   meclizine (ANTIVERT) 12.5 MG tablet    Sig: Take 1-2 tablets (12.5-25 mg total) by mouth 3 (three) times daily as needed for dizziness.    Dispense:  30 tablet    Refill:  0    Order Specific Question:   Supervising Provider    Answer:   Merrilee Jansky [id number]     *If you need refills on other medications prior to your next appointment, please contact your pharmacy*  Follow-Up: Call back or seek an in-person evaluation if the symptoms worsen or if the condition fails to improve as anticipated.  [facility] [phone]  Other Instructions Otitis Media, Adult  Otitis media occurs when there is inflammation and fluid in the middle ear with signs  and symptoms of an acute infection. The middle ear is a part of the ear that contains bones for hearing as well as air that helps send sounds to the brain. When infected fluid builds up in this space, it causes pressure and can lead to an ear infection. The eustachian tube connects the middle ear to the back of the nose (nasopharynx) and normally allows air into the middle ear. If the eustachian tube becomes blocked, fluid can build up and become infected. What are the causes? This condition is caused by a blockage in the eustachian tube. This can be caused  by mucus or by swelling of the tube. Problems that can cause a blockage include: A cold or other upper respiratory infection. Allergies. An irritant, such as tobacco smoke. Enlarged adenoids. The adenoids are areas of soft tissue located high in the back of the throat, behind the nose and the roof of the mouth. They are part of the body's defense system (immune system). A mass in the nasopharynx. Damage to the ear caused by pressure changes (barotrauma). What increases the risk? You are more likely to develop this condition if you: Smoke or are exposed to tobacco smoke. Have an opening in the roof of your mouth (cleft palate). Have gastroesophageal reflux. Have an immune system disorder. What are the signs or symptoms? Symptoms of this condition include: Ear pain. Fever. Decreased hearing. Tiredness (lethargy). Fluid leaking from the ear, if the eardrum is ruptured or has burst. Ringing in the ear. How is this diagnosed?  This condition is diagnosed with a physical exam. During the exam, your health care provider will use an instrument called an otoscope to look in your ear and check for redness, swelling, and fluid. He or she will also ask about your symptoms. Your health care provider may also order tests, such as: A pneumatic otoscopy. This is a test to check the movement of the eardrum. It is done by squeezing a small amount of air into the ear. A tympanogram. This is a test that shows how well the eardrum moves in response to air pressure in the ear canal. It provides a graph for your health care provider to review. How is this treated? This condition can go away on its own within 3-5 days. But if the condition is caused by a bacterial infection and does not go away on its own, or if it keeps coming back, your health care provider may: Prescribe antibiotic medicine to treat the infection. Prescribe or recommend medicines to control pain. Follow these instructions at home: Take  over-the-counter and prescription medicines only as told by your health care provider. If you were prescribed an antibiotic medicine, take it as told by your health care provider. Do not stop taking the antibiotic even if you start to feel better. Keep all follow-up visits. This is important. Contact a health care provider if: You have bleeding from your nose. There is a lump on your neck. You are not feeling better in 5 days. You feel worse instead of better. Get help right away if: You have severe pain that is not controlled with medicine. You have swelling, redness, or pain around your ear. You have stiffness in your neck. A part of your face is not moving (paralyzed). The bone behind your ear (mastoid bone) is tender when you touch it. You develop a severe headache. Summary Otitis media is redness, soreness, and swelling of the middle ear, usually resulting in pain and decreased hearing. This condition  can go away on its own within 3-5 days. If the problem does not go away in 3-5 days, your health care provider may give you medicines to treat the infection. If you were prescribed an antibiotic medicine, take it as told by your health care provider. Follow all instructions that were given to you by your health care provider. This information is not intended to replace advice given to you by your health care provider. Make sure you discuss any questions you have with your health care provider. Document Revised: 10/21/2020 Document Reviewed: 10/21/2020 Elsevier Patient Education  2024 Elsevier Inc.    If you have been instructed to have an in-person evaluation today at a local Urgent Care facility, please use the link below. It will take you to a list of all of our available [facility], including address, phone number and hours of operation. Please do not delay care.  [facility]  If you or a family member do not have a primary care provider, use the link  below to schedule a visit and establish care. When you choose a [facility] primary care physician or advanced practice provider, you gain a long-term partner in health. Find a Primary Care Provider  Learn more about [facility]'s in-office and virtual care options: [facility] - Get Care Now

## 2023-07-01 NOTE — Progress Notes (Signed)
Virtual Visit Consent   Sharon Gonzalez, you are scheduled for a virtual visit with a [facility] provider today. Just as with appointments in the office, your consent must be obtained to participate. Your consent will be active for this visit and any virtual visit you may have with one of our providers in the next 365 days. If you have a MyChart account, a copy of this consent can be sent to you electronically.  As this is a virtual visit, video technology does not allow for your provider to perform a traditional examination. This may limit your provider's ability to fully assess your condition. If your provider identifies any concerns that need to be evaluated in person or the need to arrange testing (such as labs, EKG, etc.), we will make arrangements to do so. Although advances in technology are sophisticated, we cannot ensure that it will always work on either your end or our end. If the connection with a video visit is poor, the visit may have to be switched to a telephone visit. With either a video or telephone visit, we are not always able to ensure that we have a secure connection.  By engaging in this virtual visit, you consent to the provision of healthcare and authorize for your insurance to be billed (if applicable) for the services provided during this visit. Depending on your insurance coverage, you may receive a charge related to this service.  I need to obtain your verbal consent now. Are you willing to proceed with your visit today? Sharon Gonzalez has provided verbal consent on 07/01/2023 for a virtual visit (video or telephone). Sharon Loveless, PA-C  Date: 07/01/2023 9:51 AM  Virtual Visit via Video Note   I, Sharon Gonzalez, connected with  Sharon Gonzalez  ([id number], [birth year]) on 07/01/23 at  9:45 AM EST by a video-enabled telemedicine application and verified that I am speaking with the correct person using two identifiers.  Location: Patient: Virtual Visit Location  Patient: Home Provider: Virtual Visit Location Provider: Home Office   I discussed the limitations of evaluation and management by telemedicine and the availability of in person appointments. The patient expressed understanding and agreed to proceed.    History of Present Illness: Sharon Gonzalez is a 22 y.o. who identifies as a female who was assigned female at birth, and is being seen today for flu-like symptoms.  HPI: URI  This is a new problem. The current episode started in the past 7 days. The problem has been gradually worsening. There has been no fever. Associated symptoms include congestion, coughing, ear pain, headaches, nausea, a plugged ear sensation, rhinorrhea, sinus pain and a sore throat. Pertinent negatives include no chest pain, diarrhea or vomiting. Associated symptoms comments: Body aches, dizziness. Treatments tried: theraflu. The treatment provided no relief.   Covid 19 testing at home    Problems:  Patient Active Problem List   Diagnosis Date Noted   Iron deficiency anemia due to chronic blood loss 11/25/2020   Thrombocytosis 11/25/2020   Elevated BP without diagnosis of hypertension 02/24/2017   Seasonal allergic rhinitis 02/24/2017   Eczema 02/24/2017   Vitamin D deficiency 01/22/2016   Peanut allergy 01/20/2016   Iron deficiency anemia 01/20/2016   Overweight, pediatric, BMI 85.0-94.9 percentile for age 59/26/2017   Acne 02/01/2013   Dysmenorrhea 02/01/2013    Allergies:  Allergies  Allergen Reactions   Peanut-Containing Drug Products Anaphylaxis   Feraheme [Ferumoxytol] Other (See Comments)    12/06/20: SOB, cough and dizziness.  Infusion  paused and patient given Pepcid 20 mg IV and Benadryl 25 mg.  Restarted & pt able to complete inf without further problems.   Medications:  Current Outpatient Medications:    amoxicillin-clavulanate (AUGMENTIN) 875-125 MG tablet, Take 1 tablet by mouth 2 (two) times daily., Disp: 20 tablet, Rfl: 0   meclizine (ANTIVERT)  12.5 MG tablet, Take 1-2 tablets (12.5-25 mg total) by mouth 3 (three) times daily as needed for dizziness., Disp: 30 tablet, Rfl: 0   benzoyl peroxide-erythromycin (BENZAMYCIN) gel, Apply topically 2 (two) times daily., Disp: 23.3 g, Rfl: 0   doxycycline (VIBRA-TABS) 100 MG tablet, Take 1 tablet (100 mg total) by mouth 2 (two) times daily., Disp: 20 tablet, Rfl: 0   ferrous sulfate 324 (65 Fe) MG TBEC, Take 1 tablet (325 mg total) by mouth daily. May take every other day if causes constipation, Disp: 90 tablet, Rfl: 1   hydrOXYzine (ATARAX/VISTARIL) 10 MG tablet, Take 1 tablet (10 mg total) by mouth 3 (three) times daily as needed., Disp: 60 tablet, Rfl: 1   ibuprofen (ADVIL) 800 MG tablet, TAKE 1 TABLET BY MOUTH AT THE SIGN OF CRAMPS THEN 6 EVERY AS NEEDED FOR PAIN, Disp: 60 tablet, Rfl: 1   ketoconazole (NIZORAL) 2 % cream, Apply 1 Application topically daily., Disp: 15 g, Rfl: 0   Levonorg-Eth Estrad Triphasic (LEVONEST) 50-30/75-40/ 125-30 MCG TABS, Take 1 tablet by mouth daily., Disp: 28 tablet, Rfl: 11   polyethylene glycol powder (GLYCOLAX/MIRALAX) 17 GM/SCOOP powder, Take one capful dissolved in 16 oz of water daily., Disp: 3350 g, Rfl: 1   triamcinolone cream (KENALOG) 0.1 %, APPLY TO ECZEMA RASH 3 TIMES DAILY FOR FLARE UPS, Disp: 240 g, Rfl: 3   Vitamin D, Ergocalciferol, (DRISDOL) 1.25 MG (50000 UNIT) CAPS capsule, Take 1 capsule (50,000 Units total) by mouth every 7 (seven) days., Disp: 12 capsule, Rfl: 1  Observations/Objective: Patient is well-developed, well-nourished in no acute distress.  Resting comfortably at home.  Head is normocephalic, atraumatic.  No labored breathing.  Speech is clear and coherent with logical content.  Patient is alert and oriented at baseline.    Assessment and Plan: 1. Flu-like symptoms  2. Non-recurrent acute serous otitis media of both ears - amoxicillin-clavulanate (AUGMENTIN) 875-125 MG tablet; Take 1 tablet by mouth 2 (two) times daily.   Dispense: 20 tablet; Refill: 0  3. Dizziness - meclizine (ANTIVERT) 12.5 MG tablet; Take 1-2 tablets (12.5-25 mg total) by mouth 3 (three) times daily as needed for dizziness.  Dispense: 30 tablet; Refill: 0  - Worsening symptoms that have not responded to OTC medications.  - Will give Augmentin - Meclizine added for dizziness - Continue saline nasal rinses - Could consider to add Flonase (Fluticasone) nasal spray over the counter for possible eustachian tube dysfunction - Steam and humidifier can help - Warm compress to ear - Stay well hydrated and get plenty of rest.  - Seek in person evaluation if no symptom improvement or if symptoms worsen   Follow Up Instructions: I discussed the assessment and treatment plan with the patient. The patient was provided an opportunity to ask questions and all were answered. The patient agreed with the plan and demonstrated an understanding of the instructions.  A copy of instructions were sent to the patient via MyChart unless otherwise noted below.    The patient was advised to call back or seek an in-person evaluation if the symptoms worsen or if the condition fails to improve as anticipated.    Sharon Dike  Epifania Gore, PA-C

## 2023-10-22 ENCOUNTER — Telehealth: Admitting: Physician Assistant

## 2023-10-22 DIAGNOSIS — R6889 Other general symptoms and signs: Secondary | ICD-10-CM | POA: Diagnosis not present

## 2023-10-23 ENCOUNTER — Encounter: Payer: Self-pay | Admitting: Physician Assistant

## 2023-10-23 NOTE — Progress Notes (Signed)
 E visit for Flu like symptoms   We are sorry that you are not feeling well.  Here is how we plan to help! Based on what you have shared with me it looks like you may have flu-like symptoms that should be watched but do not seem to indicate anti-viral treatment.  Influenza or "the flu" is   an infection caused by a respiratory virus. The flu virus is highly contagious and persons who did not receive their yearly flu vaccination may "catch" the flu from close contact.  We have anti-viral medications to treat the viruses that cause this infection. They are not a "cure" and only shorten the course of the infection. These prescriptions are most effective when they are given within the first 2 days of "flu" symptoms. Antiviral medication are indicated if you have a high risk of complications from the flu. You should  also consider an antiviral medication if you are in close contact with someone who is at risk. These medications can help patients avoid complications from the flu  but have side effects that you should know. Possible side effects from Tamiflu or oseltamivir include nausea, vomiting, diarrhea, dizziness, headaches, eye redness, sleep problems or other respiratory symptoms. You should not take Tamiflu if you have an allergy to oseltamivir or any to the ingredients in Tamiflu.  Based upon your symptoms and potential risk factors I recommend that you follow the flu symptoms recommendation that I have listed below.  ANYONE WHO HAS FLU SYMPTOMS SHOULD: Stay home. The flu is highly contagious and going out or to work exposes others! Be sure to drink plenty of fluids. Water is fine as well as fruit juices, sodas and electrolyte beverages. You may want to stay away from caffeine or alcohol. If you are nauseated, try taking small sips of liquids. How do you know if you are getting enough fluid? Your urine should be a pale yellow or almost colorless. Get rest. Taking a steamy shower or using a  humidifier may help nasal congestion and ease sore throat pain. Using a saline nasal spray works much the same way. Cough drops, hard candies and sore throat lozenges may ease your cough. Line up a caregiver. Have someone check on you regularly.   GET HELP RIGHT AWAY IF: You cannot keep down liquids or your medications. You become short of breath Your fell like you are going to pass out or loose consciousness. Your symptoms persist after you have completed your treatment plan MAKE SURE YOU  Understand these instructions. Will watch your condition. Will get help right away if you are not doing well or get worse.  Your e-visit answers were reviewed by a board certified advanced clinical practitioner to complete your personal care plan.  Depending on the condition, your plan could have included both over the counter or prescription medications.  If there is a problem please reply  once you have received a response from your provider.  Your safety is important to [place].  If you have drug allergies check your prescription carefully.    You can use MyChart to ask questions about today's visit, request a non-urgent call back, or ask for a work or school excuse for 24 hours related to this e-Visit. If it has been greater than 24 hours you will need to follow up with your provider, or enter a new e-Visit to address those concerns.  You will get an e-mail in the next two days asking about your experience.  I hope that  your e-visit has been valuable and will speed your recovery. Thank you for using e-visits.  I have spent 5 minutes in review of e-visit questionnaire, review and updating patient chart, medical decision making and response to patient.   Kasandra Knudsen Mayers, PA-C

## 2024-03-01 ENCOUNTER — Telehealth: Payer: Self-pay | Admitting: Nurse Practitioner

## 2024-03-01 NOTE — Telephone Encounter (Signed)
 Pt unconfirmed appt [id number]

## 2024-03-03 ENCOUNTER — Ambulatory Visit: Attending: Family Medicine | Admitting: Nurse Practitioner

## 2024-03-03 ENCOUNTER — Encounter: Payer: Self-pay | Admitting: Nurse Practitioner

## 2024-03-03 DIAGNOSIS — D509 Iron deficiency anemia, unspecified: Secondary | ICD-10-CM | POA: Diagnosis not present

## 2024-03-03 DIAGNOSIS — L309 Dermatitis, unspecified: Secondary | ICD-10-CM | POA: Diagnosis not present

## 2024-03-03 DIAGNOSIS — N946 Dysmenorrhea, unspecified: Secondary | ICD-10-CM | POA: Diagnosis not present

## 2024-03-03 MED ORDER — TRIAMCINOLONE ACETONIDE 0.1 % EX CREA: TOPICAL_CREAM | CUTANEOUS | 3 refills | Status: AC

## 2024-03-03 MED ORDER — IBUPROFEN 800 MG PO TABS: ORAL_TABLET | ORAL | 3 refills | Status: AC

## 2024-03-03 MED ORDER — FERROUS SULFATE 324 (65 FE) MG PO TBEC: 1.0000 | DELAYED_RELEASE_TABLET | Freq: Every day | ORAL | 3 refills | Status: AC

## 2024-03-03 NOTE — Progress Notes (Signed)
 Assessment & Plan:  [name] was seen today for establish care.  Diagnoses and all orders for this visit:  Dysmenorrhea -     ibuprofen  (ADVIL ) 800 MG tablet; TAKE 1 TABLET BY MOUTH AT THE SIGN OF CRAMPS THEN 6 EVERY AS NEEDED FOR PAIN  Eczema, unspecified type -     triamcinolone  cream (KENALOG ) 0.1 %; APPLY TO ECZEMA RASH 3 TIMES DAILY FOR FLARE UPS  Iron deficiency anemia, unspecified iron deficiency anemia type -     ferrous sulfate  324 (65 Fe) MG TBEC; Take 1 tablet (325 mg total) by mouth daily. May take every other day if causes constipation Iron deficiency anemia persists due to heavy menstrual bleeding and cessation of iron infusions. Lack of contraception contributes to heavy menstrual bleeding. - Refer to hematology for iron infusions. - Prescribe oral iron supplementation. - Schedule follow-up for labs, Pap smear, and physical examination.   Patient has been counseled on age-appropriate routine health concerns for screening and prevention. These are reviewed and up-to-date. Referrals have been placed accordingly. Immunizations are up-to-date or declined.    Subjective:   Chief Complaint  Patient presents with   Establish Care    History of Present Illness Sharon Gonzalez is a 23 year old female who presents for medication refills and management of iron deficiency anemia.  She has a history of heavy menstrual periods contributing to her iron deficiency anemia. Her iron levels were previously noted to be very low, with a level of eight. She had been referred to a hematologist and was receiving iron infusions, but she has since stopped these treatments.  She is not currently on any form of contraception to help with menstrual cycles.  She is requesting refill of triamcinolone  and ibuprofen .   Review of Systems  Constitutional:  Negative for fever, malaise/fatigue and weight loss.  HENT: Negative.  Negative for nosebleeds.   Eyes: Negative.  Negative for blurred vision,  double vision and photophobia.  Respiratory: Negative.  Negative for cough and shortness of breath.   Cardiovascular: Negative.  Negative for chest pain, palpitations and leg swelling.  Gastrointestinal: Negative.  Negative for heartburn, nausea and vomiting.  Musculoskeletal: Negative.  Negative for myalgias.  Neurological: Negative.  Negative for dizziness, focal weakness, seizures and headaches.  Psychiatric/Behavioral: Negative.  Negative for suicidal ideas.     Past Medical History:  Diagnosis Date   Acne    Allergy    seasonal allergies   Anxiety    Breast discharge 08/30/14   Enuresis    Vaginal discharge 08/30/14    Past Surgical History:  Procedure Laterality Date   NO PAST SURGERIES      Family History  Problem Relation Age of Onset   Asthma Mother    Hyperlipidemia Mother    Iron deficiency Mother    Asthma Sister    Diabetes Maternal Aunt        great aunt   Stroke Maternal Aunt        great aunt    Social History Reviewed with no changes to be made today.   Outpatient Medications Prior to Visit  Medication Sig Dispense Refill   amoxicillin -clavulanate (AUGMENTIN ) 875-125 MG tablet Take 1 tablet by mouth 2 (two) times daily. (Patient not taking: Reported on 03/03/2024) 20 tablet 0   benzoyl peroxide -erythromycin  (BENZAMYCIN) gel Apply topically 2 (two) times daily. (Patient not taking: Reported on 03/03/2024) 23.3 g 0   doxycycline  (VIBRA -TABS) 100 MG tablet Take 1 tablet (100 mg total) by mouth  2 (two) times daily. (Patient not taking: Reported on 03/03/2024) 20 tablet 0   ferrous sulfate  324 (65 Fe) MG TBEC Take 1 tablet (325 mg total) by mouth daily. May take every other day if causes constipation (Patient not taking: Reported on 03/03/2024) 90 tablet 1   hydrOXYzine  (ATARAX /VISTARIL ) 10 MG tablet Take 1 tablet (10 mg total) by mouth 3 (three) times daily as needed. (Patient not taking: Reported on 03/03/2024) 60 tablet 1   ibuprofen  (ADVIL ) 800 MG tablet TAKE 1 TABLET  BY MOUTH AT THE SIGN OF CRAMPS THEN 6 EVERY AS NEEDED FOR PAIN (Patient not taking: Reported on 03/03/2024) 60 tablet 1   ketoconazole  (NIZORAL ) 2 % cream Apply 1 Application topically daily. (Patient not taking: Reported on 03/03/2024) 15 g 0   Levonorg-Eth Estrad Triphasic (LEVONEST) 50-30/75-40/ 125-30 MCG TABS Take 1 tablet by mouth daily. (Patient not taking: Reported on 03/03/2024) 28 tablet 11   meclizine  (ANTIVERT ) 12.5 MG tablet Take 1-2 tablets (12.5-25 mg total) by mouth 3 (three) times daily as needed for dizziness. (Patient not taking: Reported on 03/03/2024) 30 tablet 0   polyethylene glycol powder (GLYCOLAX /MIRALAX ) 17 GM/SCOOP powder Take one capful dissolved in 16 oz of water daily. (Patient not taking: Reported on 03/03/2024) 3350 g 1   triamcinolone  cream (KENALOG ) 0.1 % APPLY TO ECZEMA RASH 3 TIMES DAILY FOR FLARE UPS (Patient not taking: Reported on 03/03/2024) 240 g 3   Vitamin D , Ergocalciferol , (DRISDOL ) 1.25 MG (50000 UNIT) CAPS capsule Take 1 capsule (50,000 Units total) by mouth every 7 (seven) days. (Patient not taking: Reported on 03/03/2024) 12 capsule 1   No facility-administered medications prior to visit.    Allergies  Allergen Reactions   Peanut-Containing Drug Products Anaphylaxis   Feraheme  [Ferumoxytol ] Other (See Comments)    12/06/20: SOB, cough and dizziness.  Infusion paused and patient given Pepcid  20 mg IV and Benadryl  25 mg.  Restarted & pt able to complete inf without further problems.       Objective:    BP 112/71 (BP Location: Left Arm, Patient Position: Sitting, Cuff Size: Normal)   Pulse 61   Resp 20   Ht 5' 3 (1.6 m)   Wt 158 lb 3.2 oz (71.8 kg)   LMP 02/28/2024   SpO2 100%   BMI 28.02 kg/m  Wt Readings from Last 3 Encounters:  03/03/24 158 lb 3.2 oz (71.8 kg)  11/25/20 151 lb 3.2 oz (68.6 kg)  09/18/20 149 lb (67.6 kg)    Physical Exam Vitals and nursing note reviewed.  Constitutional:      Appearance: She is well-developed.  HENT:      Head: Normocephalic and atraumatic.  Cardiovascular:     Rate and Rhythm: Normal rate and regular rhythm.     Heart sounds: Normal heart sounds. No murmur heard.    No friction rub. No gallop.  Pulmonary:     Effort: Pulmonary effort is normal. No tachypnea or respiratory distress.     Breath sounds: Normal breath sounds. No decreased breath sounds, wheezing, rhonchi or rales.  Chest:     Chest wall: No tenderness.  Abdominal:     General: Bowel sounds are normal.     Palpations: Abdomen is soft.  Musculoskeletal:        General: Normal range of motion.     Cervical back: Normal range of motion.  Skin:    General: Skin is warm and dry.  Neurological:     Mental Status: She is alert  and oriented to person, place, and time.     Coordination: Coordination normal.  Psychiatric:        Behavior: Behavior normal. Behavior is cooperative.        Thought Content: Thought content normal.        Judgment: Judgment normal.          Patient has been counseled extensively about nutrition and exercise as well as the importance of adherence with medications and regular follow-up. The patient was given clear instructions to go to ER or return to medical center if symptoms don't improve, worsen or new problems develop. The patient verbalized understanding.   Follow-up: Return for may double book but not before or after another physical.   Nature Kueker W Natania Finigan, FNP-BC [facility] and [facility] [place], [place] [phone]   03/03/2024, 11:42 AM

## 2024-03-14 ENCOUNTER — Telehealth: Payer: Self-pay | Admitting: Nurse Practitioner

## 2024-03-14 NOTE — Telephone Encounter (Signed)
 Pt unconfirmed appt 8/19 lvm

## 2024-03-15 ENCOUNTER — Other Ambulatory Visit (HOSPITAL_COMMUNITY)
Admission: RE | Admit: 2024-03-15 | Discharge: 2024-03-15 | Disposition: A | Discharge status: Home / Self Care | Source: Ambulatory Visit | Attending: Nurse Practitioner | Admitting: Nurse Practitioner

## 2024-03-15 ENCOUNTER — Encounter: Payer: Self-pay | Admitting: Nurse Practitioner

## 2024-03-15 ENCOUNTER — Ambulatory Visit: Attending: Nurse Practitioner | Admitting: Nurse Practitioner

## 2024-03-15 VITALS — BP 117/73 | HR 76 | Resp 19 | Ht 63.0 in | Wt 161.6 lb

## 2024-03-15 DIAGNOSIS — Z124 Encounter for screening for malignant neoplasm of cervix: Secondary | ICD-10-CM | POA: Insufficient documentation

## 2024-03-15 LAB — CERVICOVAGINAL ANCILLARY ONLY
Bacterial Vaginitis (gardnerella): NEGATIVE
Candida Glabrata: NEGATIVE
Candida Vaginitis: NEGATIVE
Chlamydia: NEGATIVE
Comment: NEGATIVE
Comment: NEGATIVE
Comment: NEGATIVE
Comment: NEGATIVE
Comment: NEGATIVE
Comment: NORMAL
Neisseria Gonorrhea: NEGATIVE
Trichomonas: NEGATIVE

## 2024-03-15 NOTE — Progress Notes (Signed)
 Assessment & Plan:  [name] was seen today for gynecologic exam.  Diagnoses and all orders for this visit:  Encounter for Papanicolaou smear of cervix -     Cervicovaginal ancillary only -     Cytology - PAP    Patient has been counseled on age-appropriate routine health concerns for screening and prevention. These are reviewed and up-to-date. Referrals have been placed accordingly. Immunizations are up-to-date or declined.    Subjective:   Chief Complaint  Patient presents with   Gynecologic Exam    Sharon Gonzalez 23 y.o. female presents to office today well woman exam. She is having normal menstrual cycles. She is sexually active.     Review of Systems  Constitutional: Negative.  Negative for chills, fever, malaise/fatigue and weight loss.  Respiratory: Negative.  Negative for cough, shortness of breath and wheezing.   Cardiovascular: Negative.  Negative for chest pain, orthopnea and leg swelling.  Gastrointestinal:  Negative for abdominal pain.  Genitourinary: Negative.  Negative for flank pain.  Skin: Negative.  Negative for rash.  Psychiatric/Behavioral:  Negative for suicidal ideas.     Past Medical History:  Diagnosis Date   Acne    Allergy    seasonal allergies   Anxiety    Breast discharge 08/30/14   Enuresis    Vaginal discharge 08/30/14    Past Surgical History:  Procedure Laterality Date   NO PAST SURGERIES      Family History  Problem Relation Age of Onset   Asthma Mother    Hyperlipidemia Mother    Iron deficiency Mother    Asthma Sister    Diabetes Maternal Aunt        great aunt   Stroke Maternal Aunt        great aunt    Social History Reviewed with no changes to be made today.   Outpatient Medications Prior to Visit  Medication Sig Dispense Refill   ferrous sulfate  324 (65 Fe) MG TBEC Take 1 tablet (325 mg total) by mouth daily. May take every other day if causes constipation 90 tablet 3   ibuprofen  (ADVIL ) 800 MG tablet TAKE 1 TABLET  BY MOUTH AT THE SIGN OF CRAMPS THEN 6 EVERY AS NEEDED FOR PAIN 60 tablet 3   triamcinolone  cream (KENALOG ) 0.1 % APPLY TO ECZEMA RASH 3 TIMES DAILY FOR FLARE UPS (Patient not taking: Reported on 03/15/2024) 240 g 3   No facility-administered medications prior to visit.    Allergies  Allergen Reactions   Peanut-Containing Drug Products Anaphylaxis   Feraheme  [Ferumoxytol ] Other (See Comments)    12/06/20: SOB, cough and dizziness.  Infusion paused and patient given Pepcid  20 mg IV and Benadryl  25 mg.  Restarted & pt able to complete inf without further problems.       Objective:    BP 117/73 (BP Location: Left Arm, Patient Position: Sitting, Cuff Size: Normal)   Pulse 76   Resp 19   Ht 5' 3 (1.6 m)   Wt 161 lb 9.6 oz (73.3 kg)   LMP 02/28/2024 (Exact Date)   SpO2 100%   BMI 28.63 kg/m  Wt Readings from Last 3 Encounters:  03/15/24 161 lb 9.6 oz (73.3 kg)  03/03/24 158 lb 3.2 oz (71.8 kg)  11/25/20 151 lb 3.2 oz (68.6 kg)    Physical Exam Exam conducted with a chaperone present.  Constitutional:      Appearance: She is well-developed.  HENT:     Head: Normocephalic.  Cardiovascular:  Rate and Rhythm: Normal rate and regular rhythm.     Heart sounds: Normal heart sounds.  Pulmonary:     Effort: Pulmonary effort is normal.     Breath sounds: Normal breath sounds.  Abdominal:     General: Bowel sounds are normal.     Palpations: Abdomen is soft.     Hernia: There is no hernia in the left inguinal area.  Genitourinary:    Exam position: Lithotomy position.     Labia:        Right: No rash, tenderness, lesion or injury.        Left: No rash, tenderness, lesion or injury.      Vagina: Normal. No signs of injury and foreign body. No vaginal discharge, erythema, tenderness or bleeding.     Cervix: Normal.     Uterus: Not deviated and not enlarged.      Adnexa:        Right: No mass, tenderness or fullness.         Left: No mass, tenderness or fullness.       Rectum:  Normal. No external hemorrhoid.  Lymphadenopathy:     Lower Body: No right inguinal adenopathy. No left inguinal adenopathy.  Skin:    General: Skin is warm and dry.  Neurological:     Mental Status: She is alert and oriented to person, place, and time.  Psychiatric:        Behavior: Behavior normal.        Thought Content: Thought content normal.        Judgment: Judgment normal.          Patient has been counseled extensively about nutrition and exercise as well as the importance of adherence with medications and regular follow-up. The patient was given clear instructions to go to ER or return to medical center if symptoms don't improve, worsen or new problems develop. The patient verbalized understanding.   Follow-up: Return if symptoms worsen or fail to improve.   Haze LELON Servant, FNP-BC [facility] and [facility] [place], [place] [phone]   03/15/2024, 9:24 AM

## 2024-03-16 ENCOUNTER — Ambulatory Visit: Payer: Self-pay | Admitting: Nurse Practitioner

## 2024-03-16 LAB — CYTOLOGY - PAP
Chlamydia: NEGATIVE
Comment: NEGATIVE
Comment: NEGATIVE
Comment: NORMAL
Diagnosis: NEGATIVE
Neisseria Gonorrhea: NEGATIVE
Trichomonas: NEGATIVE

## 2024-08-11 ENCOUNTER — Telehealth: Payer: Self-pay | Admitting: Nurse Practitioner

## 2024-08-11 NOTE — Telephone Encounter (Signed)
 Call cannot be completed as dial.Contacted pt left  no vm to confirmed appt !

## 2024-08-14 ENCOUNTER — Encounter: Payer: Self-pay | Admitting: Nurse Practitioner

## 2024-08-14 ENCOUNTER — Ambulatory Visit: Attending: Family Medicine | Admitting: Nurse Practitioner

## 2024-08-14 VITALS — BP 122/69 | HR 87 | Ht 63.0 in | Wt 160.8 lb

## 2024-08-14 DIAGNOSIS — E559 Vitamin D deficiency, unspecified: Secondary | ICD-10-CM | POA: Diagnosis not present

## 2024-08-14 DIAGNOSIS — L219 Seborrheic dermatitis, unspecified: Secondary | ICD-10-CM

## 2024-08-14 DIAGNOSIS — D500 Iron deficiency anemia secondary to blood loss (chronic): Secondary | ICD-10-CM | POA: Diagnosis not present

## 2024-08-14 DIAGNOSIS — L700 Acne vulgaris: Secondary | ICD-10-CM | POA: Diagnosis not present

## 2024-08-14 DIAGNOSIS — R42 Dizziness and giddiness: Secondary | ICD-10-CM | POA: Diagnosis not present

## 2024-08-14 MED ORDER — DOXYCYCLINE HYCLATE 100 MG PO TABS: 100.0000 mg | ORAL_TABLET | Freq: Every day | ORAL | 0 refills | Status: AC

## 2024-08-14 MED ORDER — KETOCONAZOLE 2 % EX SHAM: 1.0000 | MEDICATED_SHAMPOO | CUTANEOUS | 6 refills | Status: AC

## 2024-08-14 NOTE — Patient Instructions (Signed)
 [facility] RECOMMENDED

## 2024-08-14 NOTE — Progress Notes (Signed)
 "  Assessment & Plan:  Sharon Gonzalez was seen today for fatigue and dizziness.  Diagnoses and all orders for this visit:  Dizziness -     CMP14+EGFR -     CBC with Differential/Platelet -     Iron, TIBC and Ferritin Panel -     Thyroid  Panel With TSH -     Vitamin B12 Dizziness and fatigue likely due to iron deficiency anemia, possibly from heavy menstrual bleeding. Differential includes vitamin D  deficiency and thyroid  dysfunction. - Ordered CBC, iron studies, vitamin D , kidney function, and thyroid  level tests. - Recommended sublingual B12 supplementation. - Will consider referral to infusion clinic for iron infusion if iron levels are low   Acne vulgaris -     Ambulatory referral to Dermatology -     doxycycline  (VIBRA -TABS) 100 MG tablet; Take 1 tablet (100 mg total) by mouth daily. Persistent acne vulgaris possibly hormonally influenced, worsens with menstrual cycle. Previous treatments included adapalene , benzamycin, Benzaclin, doxycycline , and birth control pills without improvement. - Referred to dermatology for further evaluation and management. - Prescribed doxycycline  with instructions to take with food to minimize nausea.    Iron deficiency anemia due to chronic blood loss -     CMP14+EGFR -     CBC with Differential/Platelet -     Iron, TIBC and Ferritin Panel -     Thyroid  Panel With TSH -     Vitamin B12 Dizziness and fatigue likely due to iron deficiency anemia, possibly from heavy menstrual bleeding. Differential includes vitamin D  deficiency and thyroid  dysfunction. - Ordered CBC, iron studies, vitamin D , kidney function, and thyroid  level tests. - Recommended sublingual B12 supplementation. - Will consider referral to infusion clinic for iron infusion if iron levels are low   Seborrheic dermatitis of scalp DERMATOLOGY REFERRAL Placed -     ketoconazole  (NIZORAL ) 2 % shampoo; Apply 1 Application topically 2 (two) times a week.  Vitamin D  deficiency disease -      VITAMIN D  25 Hydroxy (Vit-D Deficiency, Fractures)  Vitamin D  deficiency -     VITAMIN D  25 Hydroxy (Vit-D Deficiency, Fractures)    Patient has been counseled on age-appropriate routine health concerns for screening and prevention. These are reviewed and up-to-date. Referrals have been placed accordingly. Immunizations are up-to-date or declined.    Subjective:   Chief Complaint  Patient presents with   Fatigue   Dizziness    Symptoms started 1 year ago.    Sharon Gonzalez 24 y.o. female presents to office today with concerns of dizziness and fatigue.   She has been experiencing fatigue, dizziness and feeling like she is going to pass out doing basic everyday things and while at work. 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 States periods are heavy the 2-3 days of menstrual cycle. She is taking iron but sometimes misses doses.  She is requesting referral to dermatology for her acne and her scalp which she reports is very dry and flaky with large hard flakes in hair. She has not previously consulted a dermatologist for this issue. Her skincare routine includes Dove soap, glycolic acid toner, Aveeno lotion, and skin protection. She has tried  various acne treatments, including adapalene , benzamycin, and Benzaclin, without success. Her acne tends to worsen around her menstrual cycle. She was on birth control pills for over six months but did not notice significant improvement in her skin condition. She frequently eats out, which she acknowledges may contribute to her acne. She has previously been on doxycycline  for a severe acne outbreak, which was effective but caused nausea when taken on an empty stomach. She is sexually active and has  had a Pap smear done.    Review of Systems  Constitutional:  Positive for malaise/fatigue. Negative for fever and weight loss.  HENT: Negative.  Negative for nosebleeds.   Eyes: Negative.  Negative for blurred vision, double vision and photophobia.  Respiratory: Negative.  Negative for cough and shortness of breath.   Cardiovascular: Negative.  Negative for chest pain, palpitations and leg swelling.  Gastrointestinal: Negative.  Negative for heartburn, nausea and vomiting.  Musculoskeletal: Negative.  Negative for myalgias.  Skin:        SEE HPI  Neurological:  Positive for dizziness and weakness. Negative for focal weakness, seizures and headaches.  Psychiatric/Behavioral: Negative.  Negative for suicidal ideas.     Past Medical History:  Diagnosis Date   Acne    Allergy    seasonal allergies   Anxiety    Breast discharge 08/30/14   Enuresis    Vaginal discharge 08/30/14    Past Surgical History:  Procedure Laterality Date   NO PAST SURGERIES      Family History  Problem Relation Age of Onset   Asthma Mother    Hyperlipidemia Mother    Iron deficiency Mother    Asthma Sister    Diabetes Maternal Aunt        great aunt   Stroke Maternal Aunt        great aunt    Social History Reviewed with no changes to be made today.   Outpatient Medications Prior to Visit  Medication Sig Dispense Refill   ferrous sulfate  324 (65 Fe) MG TBEC Take 1 tablet (325 mg total) by mouth daily. May take every other day if causes constipation 90 tablet 3   ibuprofen  (ADVIL ) 800 MG tablet TAKE 1 TABLET BY MOUTH AT THE SIGN OF CRAMPS THEN 6 EVERY AS NEEDED FOR PAIN 60 tablet 3   triamcinolone  cream (KENALOG ) 0.1 % APPLY TO ECZEMA RASH 3 TIMES DAILY FOR FLARE UPS 240 g 3   No facility-administered medications prior to visit.    Allergies[1]     Objective:    BP 122/69 (BP Location: Right Leg, Patient Position: Sitting, Cuff Size: Normal)   Pulse 87   Ht 5' 3 (1.6 m)   Wt 160 lb 12.8  oz (72.9 kg)   LMP 08/14/2024 (Exact Date)   SpO2 100%   BMI 28.48 kg/m  Wt Readings from Last 3 Encounters:  08/14/24 160 lb 12.8 oz (72.9 kg)  03/15/24 161 lb 9.6 oz (73.3 kg)  03/03/24 158 lb 3.2 oz (71.8 kg)    Physical Exam Vitals and nursing note reviewed.  Constitutional:      Appearance: She is well-developed.  HENT:     Head: Normocephalic and atraumatic.  Cardiovascular:     Rate and Rhythm: Normal rate and regular rhythm.     Heart sounds: Normal heart sounds. No murmur heard.    No friction rub. No gallop.  Pulmonary:     Effort: Pulmonary effort is normal. No tachypnea or respiratory distress.  Breath sounds: Normal breath sounds. No decreased breath sounds, wheezing, rhonchi or rales.  Chest:     Chest wall: No tenderness.  Musculoskeletal:        General: Normal range of motion.     Cervical back: Normal range of motion.  Skin:    General: Skin is warm and dry.     Findings: Acne present.     Comments: Thick white greasy scales throughout scalp  Neurological:     Mental Status: She is alert and oriented to person, place, and time.     Coordination: Coordination normal.  Psychiatric:        Behavior: Behavior normal. Behavior is cooperative.        Thought Content: Thought content normal.        Judgment: Judgment normal.          Patient has been counseled extensively about nutrition and exercise as well as the importance of adherence with medications and regular follow-up. The patient was given clear instructions to go to ER or return to medical center if symptoms don't improve, worsen or new problems develop. The patient verbalized understanding.   Follow-up: Return if symptoms worsen or fail to improve.   Haze LELON Servant, FNP-BC [facility] and [facility] [place], [place] [phone]   08/14/2024, 10:57 AM     [1]  Allergies Allergen Reactions   Peanut-Containing Drug Products Anaphylaxis   Feraheme  [Ferumoxytol ]  Other (See Comments)    12/06/20: SOB, cough and dizziness.  Infusion paused and patient given Pepcid  20 mg IV and Benadryl  25 mg.  Restarted & pt able to complete inf without further problems.   "

## 2024-08-15 ENCOUNTER — Telehealth: Payer: Self-pay

## 2024-08-15 ENCOUNTER — Ambulatory Visit: Payer: Self-pay

## 2024-08-15 ENCOUNTER — Other Ambulatory Visit: Payer: Self-pay

## 2024-08-15 ENCOUNTER — Observation Stay (HOSPITAL_COMMUNITY)
Admission: EM | Admit: 2024-08-15 | Discharge: 2024-08-16 | Disposition: A | Attending: Emergency Medicine | Admitting: Emergency Medicine

## 2024-08-15 DIAGNOSIS — N920 Excessive and frequent menstruation with regular cycle: Secondary | ICD-10-CM | POA: Insufficient documentation

## 2024-08-15 DIAGNOSIS — D509 Iron deficiency anemia, unspecified: Secondary | ICD-10-CM | POA: Diagnosis not present

## 2024-08-15 DIAGNOSIS — D649 Anemia, unspecified: Secondary | ICD-10-CM | POA: Diagnosis present

## 2024-08-15 DIAGNOSIS — E559 Vitamin D deficiency, unspecified: Secondary | ICD-10-CM | POA: Diagnosis not present

## 2024-08-15 DIAGNOSIS — D500 Iron deficiency anemia secondary to blood loss (chronic): Principal | ICD-10-CM | POA: Diagnosis present

## 2024-08-15 LAB — CMP14+EGFR
ALT: 9 IU/L (ref 0–32)
AST: 20 IU/L (ref 0–40)
Albumin: 4.5 g/dL (ref 4.0–5.0)
Alkaline Phosphatase: 58 IU/L (ref 41–116)
BUN/Creatinine Ratio: 10 (ref 9–23)
BUN: 7 mg/dL (ref 6–20)
Bilirubin Total: 0.3 mg/dL (ref 0.0–1.2)
CO2: 19 mmol/L — ABNORMAL LOW (ref 20–29)
Calcium: 9 mg/dL (ref 8.7–10.2)
Chloride: 105 mmol/L (ref 96–106)
Creatinine, Ser: 0.67 mg/dL (ref 0.57–1.00)
Globulin, Total: 3 g/dL (ref 1.5–4.5)
Glucose: 85 mg/dL (ref 70–99)
Potassium: 4.3 mmol/L (ref 3.5–5.2)
Sodium: 138 mmol/L (ref 134–144)
Total Protein: 7.5 g/dL (ref 6.0–8.5)
eGFR: 124 mL/min/1.73

## 2024-08-15 LAB — CBC WITH DIFFERENTIAL/PLATELET
Abs Immature Granulocytes: 0.1 K/uL — ABNORMAL HIGH (ref 0.00–0.07)
Basophils Absolute: 0.1 x10E3/uL (ref 0.0–0.2)
Basophils Absolute: 0.1 K/uL (ref 0.0–0.1)
Basophils Relative: 1 %
Basos: 1 %
EOS (ABSOLUTE): 0.1 x10E3/uL (ref 0.0–0.4)
Eos: 1 %
Eosinophils Absolute: 0.1 K/uL (ref 0.0–0.5)
Eosinophils Relative: 1 %
HCT: 22.3 % — ABNORMAL LOW (ref 36.0–46.0)
Hematocrit: 21.6 % — ABNORMAL LOW (ref 34.0–46.6)
Hemoglobin: 5.2 g/dL — CL (ref 11.1–15.9)
Hemoglobin: 5.5 g/dL — CL (ref 12.0–15.0)
Immature Grans (Abs): 0 x10E3/uL (ref 0.0–0.1)
Immature Granulocytes: 0 %
Lymphocytes Absolute: 3.1 x10E3/uL (ref 0.7–3.1)
Lymphocytes Relative: 38 %
Lymphs Abs: 2.6 K/uL (ref 0.7–4.0)
Lymphs: 42 %
MCH: 14.2 pg — ABNORMAL LOW (ref 26.6–33.0)
MCH: 14.7 pg — ABNORMAL LOW (ref 26.0–34.0)
MCHC: 24.1 g/dL — CL (ref 31.5–35.7)
MCHC: 24.7 g/dL — ABNORMAL LOW (ref 30.0–36.0)
MCV: 59 fL — ABNORMAL LOW (ref 79–97)
MCV: 59.5 fL — ABNORMAL LOW (ref 80.0–100.0)
Metamyelocytes Relative: 1 %
Monocytes Absolute: 0.7 x10E3/uL (ref 0.1–0.9)
Monocytes Absolute: 0 K/uL — ABNORMAL LOW (ref 0.1–1.0)
Monocytes Relative: 0 %
Monocytes: 9 %
Neutro Abs: 4 K/uL (ref 1.7–7.7)
Neutrophils Absolute: 3.5 x10E3/uL (ref 1.4–7.0)
Neutrophils Relative %: 59 %
Neutrophils: 47 %
Platelets: 239 x10E3/uL (ref 150–450)
Platelets: 146 K/uL — ABNORMAL LOW (ref 150–400)
RBC: 3.66 x10E6/uL — ABNORMAL LOW (ref 3.77–5.28)
RBC: 3.75 MIL/uL — ABNORMAL LOW (ref 3.87–5.11)
RDW: 29.9 % — ABNORMAL HIGH (ref 11.7–15.4)
RDW: 33.8 % — ABNORMAL HIGH (ref 11.5–15.5)
WBC: 7.5 x10E3/uL (ref 3.4–10.8)
WBC: 6.8 K/uL (ref 4.0–10.5)
nRBC: 0 % (ref 0.0–0.2)

## 2024-08-15 LAB — THYROID PANEL WITH TSH
Free Thyroxine Index: 2.3 (ref 1.2–4.9)
T3 Uptake Ratio: 30 % (ref 24–39)
T4, Total: 7.8 ug/dL (ref 4.5–12.0)
TSH: 0.664 u[IU]/mL (ref 0.450–4.500)

## 2024-08-15 LAB — BASIC METABOLIC PANEL WITH GFR
Anion gap: 10 (ref 5–15)
BUN: 8 mg/dL (ref 6–20)
CO2: 23 mmol/L (ref 22–32)
Calcium: 9.1 mg/dL (ref 8.9–10.3)
Chloride: 103 mmol/L (ref 98–111)
Creatinine, Ser: 0.73 mg/dL (ref 0.44–1.00)
GFR, Estimated: >60 mL/min
Glucose, Bld: 90 mg/dL (ref 70–99)
Potassium: 3.9 mmol/L (ref 3.5–5.1)
Sodium: 136 mmol/L (ref 135–145)

## 2024-08-15 LAB — IRON,TIBC AND FERRITIN PANEL
Ferritin: 5 ng/mL — ABNORMAL LOW (ref 15–150)
Iron Saturation: 13 % — ABNORMAL LOW (ref 15–55)
Iron: 69 ug/dL (ref 27–159)
Total Iron Binding Capacity: 550 ug/dL — ABNORMAL HIGH (ref 250–450)
UIBC: 481 ug/dL — ABNORMAL HIGH (ref 131–425)

## 2024-08-15 LAB — ABO/RH: ABO/RH(D): O POS

## 2024-08-15 LAB — VITAMIN D 25 HYDROXY (VIT D DEFICIENCY, FRACTURES): Vit D, 25-Hydroxy: 6.3 ng/mL — ABNORMAL LOW (ref 30.0–100.0)

## 2024-08-15 LAB — PREPARE RBC (CROSSMATCH)

## 2024-08-15 LAB — VITAMIN B12: Vitamin B-12: 765 pg/mL (ref 232–1245)

## 2024-08-15 MED ORDER — ENOXAPARIN SODIUM 40 MG/0.4ML IJ SOSY: 40.0000 mg | PREFILLED_SYRINGE | INTRAMUSCULAR | Status: DC

## 2024-08-15 MED ORDER — ACETAMINOPHEN 325 MG PO TABS
650.0000 mg | ORAL_TABLET | Freq: Four times a day (QID) | ORAL | Status: DC | PRN
  Administered 2024-08-16: 650 mg via ORAL
  Filled 2024-08-15: qty 2

## 2024-08-15 MED ORDER — POLYETHYLENE GLYCOL 3350 17 G PO PACK: 17.0000 g | PACK | Freq: Every day | ORAL | Status: DC | PRN

## 2024-08-15 MED ORDER — SODIUM CHLORIDE 0.9% FLUSH
3.0000 mL | Freq: Two times a day (BID) | INTRAVENOUS | Status: DC
  Administered 2024-08-15 – 2024-08-16 (×2): 3 mL via INTRAVENOUS

## 2024-08-15 MED ORDER — SODIUM CHLORIDE 0.9% IV SOLUTION: Freq: Once | INTRAVENOUS | Status: AC

## 2024-08-15 MED ORDER — ACETAMINOPHEN 650 MG RE SUPP: 650.0000 mg | Freq: Four times a day (QID) | RECTAL | Status: DC | PRN

## 2024-08-15 NOTE — Telephone Encounter (Signed)
 Copied from CRM [id number]. Topic: Clinical - Lab/Test Results >> Aug 15, 2024 10:24 AM Richerd B wrote: Reason for CRM: caller from [facility] with stat labs, caller hu while trying to reach office, I spoke with front desk who was already aware of hemoglobin results

## 2024-08-15 NOTE — ED Provider Triage Note (Signed)
 Emergency Medicine Provider Triage Evaluation Note  Sharon Gonzalez , a 24 y.o. female  was evaluated in triage.  Pt complains of weakness.  Pt sent here due to low hemoglobin. Pt's hemoglobin was 5.2 yesterday   Review of Systems  Positive: weakness Negative: Fever or chills no black or tarry stools   Physical Exam  BP 137/81 (BP Location: Right Arm)   Pulse (!) 101   Temp 98.8 F (37.1 C) (Oral)   Resp 18   Ht 5' 3 (1.6 m)   Wt 72.9 kg   LMP 08/14/2024 (Exact Date)   SpO2 100%   BMI 28.47 kg/m  Gen:   Awake, no distress   Resp:  Normal effort  MSK:   Moves extremities without difficulty  Other:    Medical Decision Making  Medically screening exam initiated at 3:17 PM.  Appropriate orders placed.  Sharon Gonzalez was informed that the remainder of the evaluation will be completed by another provider, this initial triage assessment does not replace that evaluation, and the importance of remaining in the ED until their evaluation is complete.  Pt gives consent for blood   Flint Sonny POUR, PA-C 08/15/24 1519

## 2024-08-15 NOTE — Telephone Encounter (Signed)
"  Please see previous TE  "

## 2024-08-15 NOTE — Telephone Encounter (Signed)
 FYI Only or Action Required?: FYI only for provider: ED advised.  Patient was last seen in primary care on 08/14/2024 by Theotis Haze ORN, NP.  Called Nurse Triage reporting No chief complaint on file..  Symptoms began today.  Interventions attempted: Nothing.  Symptoms are: stable.  Triage Disposition: No disposition on file.  Patient/caregiver understands and will follow disposition?:  Caller had questioned if she could go to UC. Is on her way to ED. Reiterated to pt ED is best place to be seen. She agreed.   Reason for Triage: Cassandra, RN contacted the patient and informed her of her hemoglobin levels. The patient was advised to go to the Emergency Room. The patient verbalized questions regarding her hemoglobin level. She is currently experiencing dizziness and a sensation of feeling hot.   Reason for Disposition  Nursing judgment  Protocols used: No Guideline or Reference Available-A-AH

## 2024-08-15 NOTE — Telephone Encounter (Signed)
Noted, patient currently in ED.

## 2024-08-15 NOTE — Telephone Encounter (Signed)
 Patient HGB - 5.2. PCP is aware and advise that patient go to ED. Patient notified that she should go to ED. Patient has agreed to go.

## 2024-08-15 NOTE — ED Provider Notes (Signed)
 " [facility] Provider Note   CSN: [id number] Arrival date & time: 08/15/24  1419     Patient presents with: No chief complaint on file.   Sharon Gonzalez is a 24 y.o. female.  24 year old female presents ED with reports of low hemoglobin per PCP.  Patient reports she was at work yesterday when she started having some lightheadedness, hot flashes, headaches which prompted her to go to her primary care.  She reports her PCP did lab work concern for anemia with history.  Her PCP called her today and prompted her to go to emergency department due to hemoglobin of 5.2 yesterday.  She reports having history of iron deficient anemia secondary to heavy periods.  Patient is currently on day 2 of her period.  She typically uses iron supplements and has had iron infusions in the past.  Patient denies any current chest pain, shortness of breath, dizziness, visual disturbances, weakness.     Prior to Admission medications  Medication Sig Start Date End Date Taking? Authorizing Provider  ferrous sulfate  324 (65 Fe) MG TBEC Take 1 tablet (325 mg total) by mouth daily. May take every other day if causes constipation 03/03/24  Yes Fleming, Zelda W, NP  ibuprofen  (ADVIL ) 800 MG tablet TAKE 1 TABLET BY MOUTH AT THE SIGN OF CRAMPS THEN 6 EVERY AS NEEDED FOR PAIN Patient taking differently: Take 800 mg by mouth daily as needed for cramping. 03/03/24  Yes Fleming, Zelda W, NP  triamcinolone  cream (KENALOG ) 0.1 % APPLY TO ECZEMA RASH 3 TIMES DAILY FOR FLARE UPS Patient taking differently: Apply 1 Application topically 2 (two) times daily as needed (flare ups). 03/03/24  Yes Theotis Haze ORN, NP  doxycycline  (VIBRA -TABS) 100 MG tablet Take 1 tablet (100 mg total) by mouth daily. Patient not taking: Reported on 08/15/2024 08/14/24   Fleming, Zelda W, NP  ketoconazole  (NIZORAL ) 2 % shampoo Apply 1 Application topically 2 (two) times a week. Patient not taking: Reported on 08/15/2024  08/14/24   Fleming, Zelda W, NP    Allergies: Peanut-containing drug products and Feraheme  [ferumoxytol ]    Review of Systems  Constitutional:  Positive for fatigue.  Respiratory:  Positive for shortness of breath.   Neurological:  Positive for headaches.  All other systems reviewed and are negative.   Updated Vital Signs BP 114/69   Pulse 87   Temp 97.8 F (36.6 C) (Oral)   Resp (!) 24   Ht 5' 3 (1.6 m)   Wt 72.9 kg   LMP 08/14/2024 (Exact Date)   SpO2 100%   BMI 28.47 kg/m   Physical Exam Vitals and nursing note reviewed.  Constitutional:      Appearance: Normal appearance.  HENT:     Head: Normocephalic and atraumatic.     Nose: Nose normal.  Eyes:     Extraocular Movements: Extraocular movements intact.     Conjunctiva/sclera: Conjunctivae normal.     Pupils: Pupils are equal, round, and reactive to light.  Cardiovascular:     Rate and Rhythm: Normal rate.  Pulmonary:     Effort: Pulmonary effort is normal. No respiratory distress.     Breath sounds: Normal breath sounds.  Abdominal:     General: Abdomen is flat.     Tenderness: There is no abdominal tenderness. There is no guarding.  Musculoskeletal:        General: Normal range of motion.     Cervical back: Normal range of motion.  Skin:  General: Skin is warm.     Capillary Refill: Capillary refill takes less than 2 seconds.  Neurological:     General: No focal deficit present.     Mental Status: She is alert.  Psychiatric:        Mood and Affect: Mood normal.        Behavior: Behavior normal.     (all labs ordered are listed, but only abnormal results are displayed) Labs Reviewed  CBC WITH DIFFERENTIAL/PLATELET - Abnormal; Notable for the following components:      Result Value   RBC 3.75 (*)    Hemoglobin 5.5 (*)    HCT 22.3 (*)    MCV 59.5 (*)    MCH 14.7 (*)    MCHC 24.7 (*)    RDW 33.8 (*)    Platelets 146 (*)    Monocytes Absolute 0.0 (*)    Abs Immature Granulocytes 0.10 (*)     All other components within normal limits  BASIC METABOLIC PANEL WITH GFR  HIV ANTIBODY (ROUTINE TESTING W REFLEX)  CBC  CBC  TYPE AND SCREEN  PREPARE RBC (CROSSMATCH)  ABO/RH    EKG: None  Radiology: No results found.   Procedures   Medications Ordered in the ED  sodium chloride  flush (NS) 0.9 % injection 3 mL (3 mLs Intravenous Given 08/15/24 2135)  acetaminophen  (TYLENOL ) tablet 650 mg (has no administration in time range)    Or  acetaminophen  (TYLENOL ) suppository 650 mg (has no administration in time range)  polyethylene glycol (MIRALAX  / GLYCOLAX ) packet 17 g (has no administration in time range)  0.9 %  sodium chloride  infusion (Manually program via Guardrails IV Fluids) (0 mLs Intravenous Stopped 08/15/24 2115)    24 y.o. female presents to the ED with complaints of headache, lightheadedness, hot flashes with history of anemia,  The differential diagnosis includes but not limited to iron deficiency anemia, migraine, electrolyte abnormality, dehydration.  (Ddx)  On arrival pt is nontoxic, vitals remarkable for tachycardia. Exam overall unremarkable.  Lab Tests:  BMP CBC ABO Rh type and screen. Significant for hemoglobin of 5.5. BMP unremarkable.  ED Course:   Patient is sitting comfortably in ED bed in no acute distress nontoxic-appearing.  Patient's hemoglobin came back at 5.5 patient has history of iron deficient anemia secondary to heavy periods.  She is currently on iron supplements and has had to have iron infusions in the past.  She is on day 2 of her menstrual cycle.  Patient denies any rectal bleeding or any other active bleeding outside of menstrual cycle.  Patient will be started on blood transfusion protocol.  Patient will be admitted at this time.  Dr. Seena was consulted and agreed to admit patient at this time for transfusion.   Portions of this note were generated with Scientist, clinical (histocompatibility and immunogenetics). Dictation errors may occur despite best attempts at  proofreading.   Final diagnoses:  Iron deficiency anemia due to chronic blood loss    ED Discharge Orders     None          Myriam Fonda RAMAN, [place] 08/16/24 0000  "

## 2024-08-15 NOTE — ED Triage Notes (Signed)
 Pt was told by PCP tom come to hospital, because hemoglobin is 5.2. Pt c/o dizziness, generalized weakness, and SOBx3yrs. Pt states sx got worse 5 months ago.

## 2024-08-15 NOTE — H&P (Addendum)
 " History and Physical   Sharon Gonzalez FMW:[id number] DOB: 2001/02/22 DOA: 08/15/2024  PCP: Theotis Haze ORN, NP   Patient coming from: Home  Chief Complaint: Anemia  HPI: Sharon Gonzalez is a 24 y.o. female with medical history significant of iron deficiency anemia, vitamin D  deficiency, acne presenting with worsening anemia after recent labs.  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  Symptoms become more severe recently so she followed up with PCP yesterday and labs were checked.  Outpatient labs showed hemoglobin of 5.2 with iron of 69, ferritin 5, normal B12, low vitamin D , normal TSH and T4.  History of prior iron deficiency anemia for which she takes oral iron but she does not take it consistently.  Iron deficiency anemia secondary to having heavy menses. Patient is currently menstruating.  Reports some headache and hot flash like symptoms as well. Patient denies fevers, chills, chest pain, abdominal pain, constipation, diarrhea, nausea, vomiting.  ED Course: Vital signs in the ED notable for heart rate initially 100s now improved to the 70s-80s.  Lab workup included BMP which was within normal limits.  CBC which showed hemoglobin confirmed to be 5.5.  Patient typed and screened in the ED.  No imaging.  Review of Systems: As per HPI otherwise all other systems reviewed and are negative.  Past Medical History:  Diagnosis Date   Acne    Allergy    seasonal allergies   Anxiety    Breast discharge 08/30/14   Enuresis    Vaginal discharge 08/30/14    Past Surgical History:  Procedure Laterality Date   NO PAST SURGERIES      Social History  reports that she has never smoked. She has never used smokeless tobacco. She reports that she does not currently use alcohol. She reports that she does not currently use drugs.  Allergies[1]  Family History  Problem  Relation Age of Onset   Asthma Mother    Hyperlipidemia Mother    Iron deficiency Mother    Asthma Sister    Diabetes Maternal Aunt        great aunt   Stroke Maternal Aunt        great aunt  Reviewed on admission  Prior to Admission medications  Medication Sig Start Date End Date Taking? Authorizing Provider  doxycycline  (VIBRA -TABS) 100 MG tablet Take 1 tablet (100 mg total) by mouth daily. 08/14/24   Theotis Haze ORN, NP  ferrous sulfate  324 (65 Fe) MG TBEC Take 1 tablet (325 mg total) by mouth daily. May take every other day if causes constipation 03/03/24   Fleming, Zelda W, NP  ibuprofen  (ADVIL ) 800 MG tablet TAKE 1 TABLET BY MOUTH AT THE SIGN OF CRAMPS THEN 6 EVERY AS NEEDED FOR PAIN 03/03/24   Fleming, Zelda W, NP  ketoconazole  (NIZORAL ) 2 % shampoo Apply 1 Application topically 2 (two) times a week. 08/14/24   Theotis Haze ORN, NP  triamcinolone  cream (KENALOG ) 0.1 % APPLY TO ECZEMA RASH 3 TIMES DAILY FOR FLARE UPS 03/03/24   Theotis Haze ORN, NP    Physical Exam: Vitals:   08/15/24 1426 08/15/24 1508  BP: 137/81   Pulse: (!) 101   Resp: 18   Temp: 98.8 F (37.1 C)   TempSrc: Oral   SpO2: 100%   Weight:  72.9 kg  Height:  5' 3 (1.6 m)  Physical Exam Constitutional:      General: She is not in acute distress.    Appearance: Normal appearance.  HENT:     Head: Normocephalic and atraumatic.     Mouth/Throat:     Mouth: Mucous membranes are moist.     Pharynx: Oropharynx is clear.  Eyes:     Extraocular Movements: Extraocular movements intact.     Pupils: Pupils are equal, round, and reactive to light.  Cardiovascular:     Rate and Rhythm: Normal rate and regular rhythm.     Pulses: Normal pulses.     Heart sounds: Normal heart sounds.  Pulmonary:     Effort: Pulmonary effort is normal. No respiratory distress.     Breath sounds: Normal breath sounds.  Abdominal:     General: Bowel sounds are normal. There is no distension.     Palpations: Abdomen is soft.      Tenderness: There is no abdominal tenderness.  Musculoskeletal:        General: No swelling or deformity.  Skin:    General: Skin is warm and dry.  Neurological:     General: No focal deficit present.     Mental Status: Mental status is at baseline.    Labs on Admission: I have personally reviewed following labs and imaging studies  CBC: Recent Labs  Lab 08/14/24 0944 08/15/24 1510  WBC 7.5 6.8  NEUTROABS 3.5 4.0  HGB 5.2* 5.5*  HCT 21.6* 22.3*  MCV 59* 59.5*  PLT 239 146*    Basic Metabolic Panel: Recent Labs  Lab 08/14/24 0944 08/15/24 1510  NA 138 136  K 4.3 3.9  CL 105 103  CO2 19* 23  GLUCOSE 85 90  BUN 7 8  CREATININE 0.67 0.73  CALCIUM 9.0 9.1    GFR: Estimated Creatinine Clearance: 102.8 mL/min (by C-G formula based on SCr of 0.73 mg/dL).  Liver Function Tests: Recent Labs  Lab 08/14/24 0944  AST 20  ALT 9  ALKPHOS 58  BILITOT 0.3  PROT 7.5  ALBUMIN 4.5    Urine analysis:    Component Value Date/Time   BILIRUBINUR negative 11/20/2013 1355   PROTEINUR negative 11/20/2013 1355   UROBILINOGEN negative 11/20/2013 1355   NITRITE negative 11/20/2013 1355   LEUKOCYTESUR moderate (2+) 11/20/2013 1355    Radiological Exams on Admission: No results found.  EKG: Not performed in the emergency department  Assessment/Plan Active Problems:   Vitamin D  deficiency   Iron deficiency anemia due to chronic blood loss   Symptomatic anemia   Symptomatic anemia Iron deficiency anemia Heavy menstrual periods > Patient with known history of iron deficiency anemia in the setting of heavy menstrual periods. > She is on oral iron but does not take it consistently.  Has had iron infusion in the past.  > Present to her PCP yesterday due to worsening fatigue interfering with her job and some lightheadedness.  > Found to have hemoglobin of 5.2 with low iron and ferritin.  Sent to the ED for evaluation and transfusion. > Hemoglobin confirmed to be 5.5 in the  ED.  2 units ordered for transfusion. - Monitor on telemetry overnight - Continue with 2 units transfusion - Continue with outpatient iron supplementation, will likely benefit from referral for infusion as well - Trend CBC  Vitamin D  deficiency > Vitamin D  noted to be 6.3 at PCP yesterday. - Start vitamin D  supplement outpatient (no significant benefit to giving a dose inpatient with likely significant added cost)  DVT prophylaxis: None, Menstruating with blood loss anemia and ambulatory Code Status:   Full Family Communication:  None on admission  Disposition Plan:   Patient is from:  Home  Anticipated DC to:  Home  Anticipated DC date:  1 day  Anticipated DC barriers: None  Consults called:  None Admission status:  Observation, pulmonary  Severity of Illness: The appropriate patient status for this patient is OBSERVATION. Observation status is judged to be reasonable and necessary in order to provide the required intensity of service to ensure the patient's safety. The patient's presenting symptoms, physical exam findings, and initial radiographic and laboratory data in the context of their medical condition is felt to place them at decreased risk for further clinical deterioration. Furthermore, it is anticipated that the patient will be medically stable for discharge from the hospital within 2 midnights of admission.    Marsa KATHEE Scurry MD [facility]  How to contact the [facility] Attending or Consulting provider 7A - 7P or covering provider during after hours 7P -7A, for this patient?   Check the care team in [facility] and look for a) attending/consulting [facility] provider listed and b) the [facility] team listed Log into [email or web address] and use [facility]'s universal password to access. If you do not have the password, please contact the hospital operator. Locate the [facility] provider you are looking for under [facility] and page to a number that you can be directly reached. If you still  have difficulty reaching the provider, please page the [facility] (Director on Call) for the Hospitalists listed on amion for assistance.  08/15/2024, 4:52 PM       [1]  Allergies Allergen Reactions   Peanut-Containing Drug Products Anaphylaxis   Feraheme  [Ferumoxytol ] Other (See Comments)    12/06/20: SOB, cough and dizziness.  Infusion paused and patient given Pepcid  20 mg IV and Benadryl  25 mg.  Restarted & pt able to complete inf without further problems.   "

## 2024-08-16 ENCOUNTER — Ambulatory Visit: Payer: Self-pay | Admitting: Nurse Practitioner

## 2024-08-16 ENCOUNTER — Other Ambulatory Visit: Payer: Self-pay | Admitting: Nurse Practitioner

## 2024-08-16 LAB — CBC
HCT: 24.9 % — ABNORMAL LOW (ref 36.0–46.0)
HCT: 27.8 % — ABNORMAL LOW (ref 36.0–46.0)
Hemoglobin: 7.2 g/dL — ABNORMAL LOW (ref 12.0–15.0)
Hemoglobin: 8.2 g/dL — ABNORMAL LOW (ref 12.0–15.0)
MCH: 19.3 pg — ABNORMAL LOW (ref 26.0–34.0)
MCH: 19.7 pg — ABNORMAL LOW (ref 26.0–34.0)
MCHC: 28.9 g/dL — ABNORMAL LOW (ref 30.0–36.0)
MCHC: 29.5 g/dL — ABNORMAL LOW (ref 30.0–36.0)
MCV: 66.8 fL — ABNORMAL LOW (ref 80.0–100.0)
MCV: 66.7 fL — ABNORMAL LOW (ref 80.0–100.0)
Platelets: 100 K/uL — ABNORMAL LOW (ref 150–400)
Platelets: 93 K/uL — ABNORMAL LOW (ref 150–400)
RBC: 3.73 MIL/uL — ABNORMAL LOW (ref 3.87–5.11)
RBC: 4.17 MIL/uL (ref 3.87–5.11)
WBC: 9 K/uL (ref 4.0–10.5)
WBC: 12.9 K/uL — ABNORMAL HIGH (ref 4.0–10.5)
nRBC: 0 % (ref 0.0–0.2)
nRBC: 0 % (ref 0.0–0.2)

## 2024-08-16 LAB — HIV ANTIBODY (ROUTINE TESTING W REFLEX): HIV Screen 4th Generation wRfx: NONREACTIVE

## 2024-08-16 LAB — TYPE AND SCREEN
ABO/RH(D): O POS
Antibody Screen: NEGATIVE
Unit division: 0
Unit division: 0

## 2024-08-16 LAB — BPAM RBC
Blood Product Expiration Date: 202602142359
Blood Product Expiration Date: 202602142359
ISSUE DATE / TIME: 202601201836
ISSUE DATE / TIME: 202601202116
Unit Type and Rh: 5100
Unit Type and Rh: 5100

## 2024-08-16 MED ORDER — VITAMIN D (ERGOCALCIFEROL) 1.25 MG (50000 UNIT) PO CAPS: 50000.0000 [IU] | ORAL_CAPSULE | ORAL | 0 refills | Status: DC

## 2024-08-16 MED ORDER — VITAMIN D (ERGOCALCIFEROL) 1.25 MG (50000 UNIT) PO CAPS: 50000.0000 [IU] | ORAL_CAPSULE | ORAL | 1 refills | Status: AC

## 2024-08-16 MED ORDER — VITAMIN D (ERGOCALCIFEROL) 1.25 MG (50000 UNIT) PO CAPS
50000.0000 [IU] | ORAL_CAPSULE | ORAL | Status: DC
  Administered 2024-08-16: 50000 [IU] via ORAL
  Filled 2024-08-16: qty 1

## 2024-08-16 NOTE — Plan of Care (Signed)
" °  Problem: Education: Goal: Knowledge of General Education information will improve Description: Including pain rating scale, medication(s)/side effects and non-pharmacologic comfort measures 08/16/2024 0954 by Delores Kirsch, RN Outcome: Adequate for Discharge 08/16/2024 0954 by Delores Kirsch, RN Outcome: Progressing   Problem: Health Behavior/Discharge Planning: Goal: Ability to manage health-related needs will improve 08/16/2024 0954 by Delores Kirsch, RN Outcome: Adequate for Discharge 08/16/2024 0954 by Delores Kirsch, RN Outcome: Progressing   Problem: Clinical Measurements: Goal: Ability to maintain clinical measurements within normal limits will improve Outcome: Adequate for Discharge Goal: Will remain free from infection Outcome: Adequate for Discharge Goal: Diagnostic test results will improve Outcome: Adequate for Discharge Goal: Respiratory complications will improve Outcome: Adequate for Discharge Goal: Cardiovascular complication will be avoided Outcome: Adequate for Discharge   Problem: Activity: Goal: Risk for activity intolerance will decrease Outcome: Adequate for Discharge   Problem: Nutrition: Goal: Adequate nutrition will be maintained Outcome: Adequate for Discharge   Problem: Coping: Goal: Level of anxiety will decrease Outcome: Adequate for Discharge   Problem: Elimination: Goal: Will not experience complications related to bowel motility Outcome: Adequate for Discharge Goal: Will not experience complications related to urinary retention Outcome: Adequate for Discharge   Problem: Pain Managment: Goal: General experience of comfort will improve and/or be controlled Outcome: Adequate for Discharge   Problem: Safety: Goal: Ability to remain free from injury will improve Outcome: Adequate for Discharge   Problem: Skin Integrity: Goal: Risk for impaired skin integrity will decrease Outcome: Adequate for Discharge   "

## 2024-08-16 NOTE — Hospital Course (Addendum)
 Sharon Gonzalez

## 2024-08-16 NOTE — Discharge Summary (Signed)
 "  Physician Discharge Summary  Sharon Gonzalez FMW:[id number] DOB: [birth year] DOA: 08/15/2024  PCP: Theotis Haze ORN, NP  Admit date: 08/15/2024 Discharge date: 08/16/2024  Admitted From: Home  Discharge disposition: Home   Recommendations for Outpatient Follow-Up:   Follow up with your primary care provider in one week.  Check CBC, BMP, magnesium in the next visit Discuss with your primary care provider regarding IV iron infusion/gynecology referral.   Discharge Diagnosis:   Principal Problem:   Symptomatic anemia Active Problems:   Vitamin D  deficiency   Iron deficiency anemia due to chronic blood loss   Discharge Condition: Improved.  Diet recommendation:  Regular.  Wound care: None.  Code status: Full.   History of Present Illness:   Sharon Gonzalez is a 24 y.o. 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  Patient followed up with PCP and outpatient labs were notable for hemoglobin of 5.2 with iron of 69 and ferritin of 5 with normal B12 low vitamin D  and normal TSH and T4.  Patient takes iron as outpatient but has not been consistent.  In the ED patient was slightly tachycardic.  Hemoglobin again was at 5.5.  Patient was then admitted hospital for further evaluation and treatment.  Presenting with worsening anemia after recent labs.  Hospital Course:   Following conditions were addressed during hospitalization as listed below,  Symptomatic anemia Iron deficiency anemia Heavy menstrual periods Patient with known history of iron deficiency anemia in the setting of heavy menstrual periods.  Not taking iron consistently.  Had iron infusion in the past.  Completed 2 units of packed RBC will need to continue iron supplementation as outpatient.  Advised her to follow-up with  PCP for iron transfusion as outpatient and gynecological consultation referral   Vitamin D  deficiency  Vitamin D  noted to be 6.3 at PCP yesterday.  Will continue with 50,000 units of vitamin D  on discharge for 8 weeks on discharge.  Will need to continue daily vitamin D  after that..  Disposition.  At this time, patient is stable for disposition home with outpatient PCP follow-up.  Spoke with the patient's mother at bedside  Medical Consultants:   None.  Procedures:    PRBC transfusion 2 units Subjective:   Today, patient was seen and examined at bedside.  Complains of mild headache.  Denies any chest pain shortness of breath dizziness fatigue or lightheadedness  Discharge Exam:   Vitals:   08/16/24 0345 08/16/24 0736  BP: 125/78 119/73  Pulse: 86 (!) 102  Resp: 17 17  Temp: 98.3 F (36.8 C) 100.1 F (37.8 C)  SpO2: 100% 100%   Vitals:   08/15/24 2345 08/15/24 2358 08/16/24 0345 08/16/24 0736  BP: 110/62 127/78 125/78 119/73  Pulse: 91 77 86 (!) 102  Resp:  20 17 17   Temp: 97.9 F (36.6 C) 97.8 F (36.6 C) 98.3 F (36.8 C) 100.1 F (37.8 C)  TempSrc:  Oral Oral Oral  SpO2: 100% 100% 100% 100%  Weight:      Height:       Body mass index is 28.47 kg/m.   General: Alert awake, not in obvious distress HENT: pupils equally reacting to light, mild pallor noted oral mucosa is moist.  Chest:    Diminished breath sounds bilaterally. No crackles or wheezes.  CVS: S1 &S2 heard. No murmur.  Regular rate and rhythm. Abdomen: Soft, nontender, nondistended.  Bowel sounds are heard.   Extremities: No cyanosis, clubbing or edema.  Peripheral pulses are palpable. Psych: Alert, awake and oriented, normal mood CNS:  No cranial nerve deficits.  Power equal in all extremities.   Skin: Warm and dry.  No rashes noted.  The results of significant diagnostics from this hospitalization (including imaging, microbiology, ancillary and laboratory) are listed below for reference.      Diagnostic Studies:   No results found.   Labs:   Basic Metabolic Panel: Recent Labs  Lab 08/14/24 0944 08/15/24 1510  NA 138 136  K 4.3 3.9  CL 105 103  CO2 19* 23  GLUCOSE 85 90  BUN 7 8  CREATININE 0.67 0.73  CALCIUM 9.0 9.1   GFR Estimated Creatinine Clearance: 102.8 mL/min (by C-G formula based on SCr of 0.73 mg/dL). Liver Function Tests: Recent Labs  Lab 08/14/24 0944  AST 20  ALT 9  ALKPHOS 58  BILITOT 0.3  PROT 7.5  ALBUMIN 4.5   No results for input(s): LIPASE, AMYLASE in the last 168 hours. No results for input(s): AMMONIA in the last 168 hours. Coagulation profile No results for input(s): INR, PROTIME in the last 168 hours.  CBC: Recent Labs  Lab 08/14/24 0944 08/15/24 1510 08/15/24 2340 08/16/24 0444  WBC 7.5 6.8 9.0 12.9*  NEUTROABS 3.5 4.0  --   --   HGB 5.2* 5.5* 7.2* 8.2*  HCT 21.6* 22.3* 24.9* 27.8*  MCV 59* 59.5* 66.8* 66.7*  PLT 239 146* 100* 93*   Cardiac Enzymes: No results for input(s): CKTOTAL, CKMB, CKMBINDEX, TROPONINI in the last 168 hours. BNP: Invalid input(s): POCBNP CBG: No results for input(s): GLUCAP in the last 168 hours. D-Dimer No results for input(s): DDIMER in the last 72 hours. Hgb A1c No results for input(s): HGBA1C in the last 72 hours. Lipid Profile No results for input(s): CHOL, HDL, LDLCALC, TRIG, CHOLHDL, LDLDIRECT in the last 72 hours. Thyroid  function studies Recent Labs    08/14/24 0944  TSH 0.664  T4TOTAL 7.8   Anemia work up Recent Labs    08/14/24 0944  VITAMINB12 765  FERRITIN 5*  TIBC 550*  IRON 69   Microbiology No results found for this or any previous visit (from the past 240 hours).   Discharge Instructions:   Discharge Instructions     Diet general   Complete by: As directed    Discharge instructions   Complete by: As directed    Follow-up with your primary care provider in 1 week.  Discuss about IV iron infusion and  gynecological follow-up  With your primary care provider.  Seek medical attention for worsening symptoms.  Take 50,000 units of vitamin D  as prescribed (or can buy [facility]) every week for 8 weeks followed by 10,000 units of vitamin D  over-the-counter after 8 weeks.   Increase activity slowly   Complete by: As directed       Allergies as of 08/16/2024       Reactions   Peanut-containing Drug Products Anaphylaxis   Feraheme  [ferumoxytol ] Other (See Comments)   12/06/20: SOB, cough and dizziness.  Infusion paused and patient given Pepcid  20 mg IV and Benadryl  25 mg.  Restarted & pt able to complete inf without further problems.        Medication List     TAKE these medications    doxycycline  100 MG tablet Commonly known as: VIBRA -TABS Take 1 tablet (100 mg total) by  mouth daily.   ferrous sulfate  324 (65 Fe) MG Tbec Take 1 tablet (325 mg total) by mouth daily. May take every other day if causes constipation   ibuprofen  800 MG tablet Commonly known as: ADVIL  TAKE 1 TABLET BY MOUTH AT THE SIGN OF CRAMPS THEN 6 EVERY AS NEEDED FOR PAIN What changed:  how much to take how to take this when to take this reasons to take this additional instructions   ketoconazole  2 % shampoo Commonly known as: NIZORAL  Apply 1 Application topically 2 (two) times a week.   triamcinolone  cream 0.1 % Commonly known as: KENALOG  APPLY TO ECZEMA RASH 3 TIMES DAILY FOR FLARE UPS What changed:  how much to take how to take this when to take this reasons to take this additional instructions   Vitamin D  (Ergocalciferol ) 1.25 MG (50000 UNIT) Caps capsule Commonly known as: DRISDOL  Take 1 capsule (50,000 Units total) by mouth every 7 (seven) days.        Follow-up Information     Theotis Haze ORN, NP Follow up in 1 week(s).   Specialty: Nurse Practitioner Why: discuss iron infusion, gyne consult Contact information: [address] [place] [address] [phone]                   Time coordinating discharge: 39 minutes  Signed:  Ellissa Ayo  [facility] 08/16/2024, 9:25 AM          "

## 2024-08-16 NOTE — Plan of Care (Signed)
   Problem: Education: Goal: Knowledge of General Education information will improve Description Including pain rating scale, medication(s)/side effects and non-pharmacologic comfort measures Outcome: Progressing   Problem: Health Behavior/Discharge Planning: Goal: Ability to manage health-related needs will improve Outcome: Progressing

## 2024-08-17 ENCOUNTER — Telehealth: Payer: Self-pay

## 2024-08-17 NOTE — Transitions of Care (Post Inpatient/ED Visit) (Signed)
 "  08/17/2024  Name: Neelam Tiggs MRN: [id number] DOB: 02-23-2001  Today's TOC FU Call Status: Today's TOC FU Call Status:: Successful TOC FU Call Completed TOC FU Call Complete Date: 08/17/24  Patient's Name and Date of Birth confirmed. Name, DOB  Transition Care Management Follow-up Telephone Call Date of Discharge: 08/16/24 Discharge Facility[facility] Type of Discharge: Inpatient Admission Primary Inpatient Discharge Diagnosis:: symptomatic anemia How have you been since you were released from the hospital?: Better Any questions or concerns?: No  Items Reviewed: Did you receive and understand the discharge instructions provided?: Yes Medications obtained,verified, and reconciled?: Yes (Medications Reviewed) (She still has to pick up the Vitamin D ) Any new allergies since your discharge?: No Dietary orders reviewed?: Yes Type of Diet Ordered:: general- per AVS Do you have support at home?: Yes People in Home [RPT]: sibling(s) Name of Support/Comfort Primary Source: She is staying with her sister.  Medications Reviewed Today: Medications Reviewed Today     Reviewed by Marvis Bradley, RN (Case Manager) on 08/17/24 at 1231  Med List Status: <None>   Medication Order Taking? Sig Documenting Provider Last Dose Status Informant  doxycycline  (VIBRA -TABS) 100 MG tablet [id number]  Take 1 tablet (100 mg total) by mouth daily.  Patient not taking: Reported on 08/15/2024   Theotis Haze ORN, NP  Active Self, Pharmacy Records           Med Note HAROLDINE, MAINE A   Tue Aug 15, 2024  9:56 PM) New Rx; not started yet  ferrous sulfate  324 (65 Fe) MG TBEC [id number]  Take 1 tablet (325 mg total) by mouth daily. May take every other day if causes constipation Theotis Haze ORN, NP  Active Self, Pharmacy Records           Med Note HAROLDINE, MAINE A   Tue Aug 15, 2024 10:00 PM) Patient reports avoiding taking this Rx due to constipation effect  ibuprofen  (ADVIL ) 800 MG tablet [id number]   TAKE 1 TABLET BY MOUTH AT THE SIGN OF CRAMPS THEN 6 EVERY AS NEEDED FOR PAIN  Patient taking differently: Take 800 mg by mouth daily as needed for cramping.   Theotis Haze ORN, NP  Active Self, Pharmacy Records           Med Note HAROLDINE, MAINE A   Tue Aug 15, 2024  9:53 PM)    ketoconazole  (NIZORAL ) 2 % shampoo [id number]  Apply 1 Application topically 2 (two) times a week.  Patient not taking: Reported on 08/15/2024   Theotis Haze ORN, NP  Active Self, Pharmacy Records           Med Note HAROLDINE, MAINE A   Tue Aug 15, 2024  9:57 PM) New Rx; not started yet  triamcinolone  cream (KENALOG ) 0.1 % [id number]  APPLY TO ECZEMA RASH 3 TIMES DAILY FOR FLARE UPS  Patient taking differently: Apply 1 Application topically 2 (two) times daily as needed (flare ups).   Theotis Haze ORN, NP  Active Self, Pharmacy Records  Vitamin D , Ergocalciferol , (DRISDOL ) 1.25 MG (50000 UNIT) CAPS capsule [id number]  Take 1 capsule (50,000 Units total) by mouth every 7 (seven) days.  Patient not taking: Reported on 08/17/2024   Theotis Haze ORN, NP  Active             Home Care and Equipment/Supplies: Were Home Health Services Ordered?: No Any new equipment or medical supplies ordered?: No  Functional Questionnaire: Do you need assistance with bathing/showering or dressing?: No Do you need  assistance with meal preparation?: No Do you need assistance with eating?: No Do you have difficulty maintaining continence: No Do you need assistance with getting out of bed/getting out of a chair/moving?: No Do you have difficulty managing or taking your medications?: No  Follow up appointments reviewed: PCP Follow-up appointment confirmed?: Yes Date of PCP follow-up appointment?: 08/28/24 Follow-up Provider: Haze Servant, NP Specialist Hospital Follow-up appointment confirmed?: NA Do you need transportation to your follow-up appointment?: No Do you understand care options if your condition(s) worsen?: Yes-patient  verbalized understanding    SIGNATURE Slater Diesel, RN   "

## 2024-08-28 ENCOUNTER — Ambulatory Visit: Payer: Self-pay | Admitting: Nurse Practitioner

## 2024-08-28 ENCOUNTER — Telehealth: Payer: Self-pay | Admitting: Nurse Practitioner

## 2024-08-28 NOTE — Telephone Encounter (Signed)
 Contacted pt phone not available to answer nor leave a vm to resch appt (office closed due to weather )  if the pt calls back please resch appt
